# Patient Record
Sex: Female | Born: 1960 | Race: White | Hispanic: No | Marital: Married | Smoking: Never smoker
Health system: Southern US, Community
[De-identification: ages and names within clinical notes are randomized; demographics above are authoritative.]

## PROBLEM LIST (undated history)

## (undated) DIAGNOSIS — M5020 Other cervical disc displacement, unspecified cervical region: Secondary | ICD-10-CM

## (undated) DIAGNOSIS — E781 Pure hyperglyceridemia: Secondary | ICD-10-CM

## (undated) DIAGNOSIS — N816 Rectocele: Secondary | ICD-10-CM

## (undated) DIAGNOSIS — E7800 Pure hypercholesterolemia, unspecified: Secondary | ICD-10-CM

## (undated) DIAGNOSIS — IMO0002 Reserved for concepts with insufficient information to code with codable children: Secondary | ICD-10-CM

## (undated) DIAGNOSIS — N393 Stress incontinence (female) (male): Secondary | ICD-10-CM

## (undated) DIAGNOSIS — M5030 Other cervical disc degeneration, unspecified cervical region: Secondary | ICD-10-CM

## (undated) DIAGNOSIS — J302 Other seasonal allergic rhinitis: Secondary | ICD-10-CM

## (undated) DIAGNOSIS — Z803 Family history of malignant neoplasm of breast: Secondary | ICD-10-CM

## (undated) DIAGNOSIS — J45909 Unspecified asthma, uncomplicated: Secondary | ICD-10-CM

## (undated) DIAGNOSIS — Z8041 Family history of malignant neoplasm of ovary: Secondary | ICD-10-CM

## (undated) DIAGNOSIS — N952 Postmenopausal atrophic vaginitis: Secondary | ICD-10-CM

## (undated) DIAGNOSIS — D649 Anemia, unspecified: Secondary | ICD-10-CM

## (undated) DIAGNOSIS — N809 Endometriosis, unspecified: Secondary | ICD-10-CM

## (undated) DIAGNOSIS — R112 Nausea with vomiting, unspecified: Secondary | ICD-10-CM

## (undated) DIAGNOSIS — R638 Other symptoms and signs concerning food and fluid intake: Secondary | ICD-10-CM

## (undated) DIAGNOSIS — G43909 Migraine, unspecified, not intractable, without status migrainosus: Secondary | ICD-10-CM

## (undated) DIAGNOSIS — N951 Menopausal and female climacteric states: Secondary | ICD-10-CM

## (undated) DIAGNOSIS — N6019 Diffuse cystic mastopathy of unspecified breast: Secondary | ICD-10-CM

## (undated) DIAGNOSIS — Z8262 Family history of osteoporosis: Secondary | ICD-10-CM

## (undated) DIAGNOSIS — E538 Deficiency of other specified B group vitamins: Secondary | ICD-10-CM

## (undated) DIAGNOSIS — R0789 Other chest pain: Secondary | ICD-10-CM

## (undated) DIAGNOSIS — Z9889 Other specified postprocedural states: Secondary | ICD-10-CM

## (undated) HISTORY — PX: VAGINAL HYSTERECTOMY: SUR661

## (undated) HISTORY — DX: Pure hyperglyceridemia: E78.1

## (undated) HISTORY — DX: Other cervical disc degeneration, unspecified cervical region: M50.30

## (undated) HISTORY — DX: Family history of malignant neoplasm of breast: Z80.3

## (undated) HISTORY — DX: Other chest pain: R07.89

## (undated) HISTORY — PX: APPENDECTOMY: SHX54

## (undated) HISTORY — DX: Menopausal and female climacteric states: N95.1

## (undated) HISTORY — DX: Pure hypercholesterolemia, unspecified: E78.00

## (undated) HISTORY — PX: OOPHORECTOMY: SHX86

## (undated) HISTORY — DX: Reserved for concepts with insufficient information to code with codable children: IMO0002

## (undated) HISTORY — DX: Family history of osteoporosis: Z82.62

## (undated) HISTORY — DX: Postmenopausal atrophic vaginitis: N95.2

## (undated) HISTORY — DX: Other seasonal allergic rhinitis: J30.2

## (undated) HISTORY — DX: Family history of malignant neoplasm of ovary: Z80.41

## (undated) HISTORY — DX: Endometriosis, unspecified: N80.9

## (undated) HISTORY — DX: Rectocele: N81.6

## (undated) HISTORY — DX: Stress incontinence (female) (male): N39.3

## (undated) HISTORY — DX: Diffuse cystic mastopathy of unspecified breast: N60.19

## (undated) HISTORY — DX: Other cervical disc displacement, unspecified cervical region: M50.20

## (undated) HISTORY — PX: LAPAROSCOPY: SHX197

## (undated) HISTORY — DX: Deficiency of other specified B group vitamins: E53.8

## (undated) HISTORY — DX: Unspecified asthma, uncomplicated: J45.909

## (undated) HISTORY — DX: Migraine, unspecified, not intractable, without status migrainosus: G43.909

## (undated) HISTORY — DX: Other symptoms and signs concerning food and fluid intake: R63.8

---

## 1997-12-05 ENCOUNTER — Other Ambulatory Visit: Admission: RE | Admit: 1997-12-05 | Discharge: 1997-12-05 | Payer: Self-pay | Admitting: *Deleted

## 1999-04-11 ENCOUNTER — Other Ambulatory Visit: Admission: RE | Admit: 1999-04-11 | Discharge: 1999-04-11 | Payer: Self-pay | Admitting: *Deleted

## 1999-04-12 ENCOUNTER — Encounter: Payer: Self-pay | Admitting: *Deleted

## 1999-04-12 ENCOUNTER — Encounter: Admission: RE | Admit: 1999-04-12 | Discharge: 1999-04-12 | Payer: Self-pay | Admitting: *Deleted

## 2000-10-28 ENCOUNTER — Other Ambulatory Visit: Admission: RE | Admit: 2000-10-28 | Discharge: 2000-10-28 | Payer: Self-pay | Admitting: *Deleted

## 2001-01-07 ENCOUNTER — Encounter: Admission: RE | Admit: 2001-01-07 | Discharge: 2001-01-07 | Payer: Self-pay | Admitting: *Deleted

## 2001-01-07 ENCOUNTER — Encounter: Payer: Self-pay | Admitting: *Deleted

## 2002-01-18 ENCOUNTER — Encounter: Admission: RE | Admit: 2002-01-18 | Discharge: 2002-01-18 | Payer: Self-pay | Admitting: *Deleted

## 2002-01-18 ENCOUNTER — Encounter: Payer: Self-pay | Admitting: *Deleted

## 2002-01-20 ENCOUNTER — Other Ambulatory Visit: Admission: RE | Admit: 2002-01-20 | Discharge: 2002-01-20 | Payer: Self-pay | Admitting: *Deleted

## 2004-11-01 ENCOUNTER — Ambulatory Visit: Payer: Self-pay

## 2006-11-12 ENCOUNTER — Ambulatory Visit: Payer: Self-pay | Admitting: Obstetrics and Gynecology

## 2007-02-26 HISTORY — PX: BILATERAL SALPINGOOPHORECTOMY: SHX1223

## 2007-10-21 ENCOUNTER — Ambulatory Visit: Payer: Self-pay | Admitting: Specialist

## 2007-10-21 ENCOUNTER — Other Ambulatory Visit: Payer: Self-pay

## 2007-10-28 ENCOUNTER — Ambulatory Visit: Payer: Self-pay | Admitting: Specialist

## 2007-11-19 ENCOUNTER — Ambulatory Visit: Payer: Self-pay

## 2008-02-04 ENCOUNTER — Ambulatory Visit: Payer: Self-pay | Admitting: Obstetrics and Gynecology

## 2008-02-08 ENCOUNTER — Ambulatory Visit: Payer: Self-pay | Admitting: Obstetrics and Gynecology

## 2008-12-29 ENCOUNTER — Ambulatory Visit: Payer: Self-pay

## 2011-12-19 ENCOUNTER — Ambulatory Visit: Payer: Self-pay | Admitting: Specialist

## 2012-06-09 ENCOUNTER — Other Ambulatory Visit: Payer: Self-pay | Admitting: Neurology

## 2012-06-09 LAB — COMPREHENSIVE METABOLIC PANEL
Albumin: 4.1 g/dL (ref 3.4–5.0)
Alkaline Phosphatase: 69 U/L (ref 50–136)
Anion Gap: 5 — ABNORMAL LOW (ref 7–16)
BUN: 23 mg/dL — ABNORMAL HIGH (ref 7–18)
Bilirubin,Total: 0.3 mg/dL (ref 0.2–1.0)
Calcium, Total: 8.8 mg/dL (ref 8.5–10.1)
Chloride: 105 mmol/L (ref 98–107)
Co2: 28 mmol/L (ref 21–32)
Creatinine: 1.02 mg/dL (ref 0.60–1.30)
EGFR (African American): >60
EGFR (Non-African Amer.): >60
Glucose: 106 mg/dL — ABNORMAL HIGH (ref 65–99)
Osmolality: 280 (ref 275–301)
Potassium: 3.8 mmol/L (ref 3.5–5.1)
SGOT(AST): 21 U/L (ref 15–37)
SGPT (ALT): 31 U/L (ref 12–78)
Sodium: 138 mmol/L (ref 136–145)
Total Protein: 7.7 g/dL (ref 6.4–8.2)

## 2012-06-09 LAB — CBC WITH DIFFERENTIAL/PLATELET
Basophil #: 0 10*3/uL (ref 0.0–0.1)
Basophil %: 0.8 %
Eosinophil #: 0.1 10*3/uL (ref 0.0–0.7)
Eosinophil %: 1.2 %
HCT: 38.8 % (ref 35.0–47.0)
HGB: 13.4 g/dL (ref 12.0–16.0)
Lymphocyte #: 2 10*3/uL (ref 1.0–3.6)
Lymphocyte %: 32.5 %
MCH: 29.5 pg (ref 26.0–34.0)
MCHC: 34.5 g/dL (ref 32.0–36.0)
MCV: 85 fL (ref 80–100)
Monocyte #: 0.3 x10 3/mm (ref 0.2–0.9)
Monocyte %: 5.4 %
Neutrophil #: 3.7 10*3/uL (ref 1.4–6.5)
Neutrophil %: 60.1 %
Platelet: 234 10*3/uL (ref 150–440)
RBC: 4.55 10*6/uL (ref 3.80–5.20)
RDW: 14.3 % (ref 11.5–14.5)
WBC: 6.1 10*3/uL (ref 3.6–11.0)

## 2012-06-09 LAB — TSH: Thyroid Stimulating Horm: 1.96 u[IU]/mL

## 2012-06-09 LAB — HEMOGLOBIN A1C: Hemoglobin A1C: 5.9 %

## 2012-06-09 LAB — SEDIMENTATION RATE: Erythrocyte Sed Rate: 18 mm/h

## 2012-06-23 ENCOUNTER — Ambulatory Visit: Payer: Self-pay | Admitting: Neurology

## 2013-06-24 LAB — HM PAP SMEAR: HM PAP: NEGATIVE

## 2013-10-13 ENCOUNTER — Ambulatory Visit: Payer: Self-pay | Admitting: Obstetrics and Gynecology

## 2013-10-14 LAB — HM MAMMOGRAPHY

## 2013-10-18 ENCOUNTER — Ambulatory Visit: Payer: Self-pay | Admitting: Gastroenterology

## 2013-10-18 LAB — HM COLONOSCOPY: HM COLON: NORMAL

## 2014-06-28 ENCOUNTER — Other Ambulatory Visit: Payer: Self-pay | Admitting: Obstetrics and Gynecology

## 2014-06-28 DIAGNOSIS — Z780 Asymptomatic menopausal state: Secondary | ICD-10-CM

## 2014-06-28 DIAGNOSIS — Z8262 Family history of osteoporosis: Secondary | ICD-10-CM

## 2014-06-28 DIAGNOSIS — Z01419 Encounter for gynecological examination (general) (routine) without abnormal findings: Secondary | ICD-10-CM

## 2014-07-05 ENCOUNTER — Ambulatory Visit: Payer: Self-pay

## 2014-07-12 ENCOUNTER — Ambulatory Visit: Payer: Self-pay

## 2014-07-20 ENCOUNTER — Ambulatory Visit
Admission: RE | Admit: 2014-07-20 | Discharge: 2014-07-20 | Disposition: A | Discharge status: Home / Self Care | Payer: 59 | Source: Ambulatory Visit | Attending: Obstetrics and Gynecology | Admitting: Obstetrics and Gynecology

## 2014-07-20 DIAGNOSIS — Z780 Asymptomatic menopausal state: Secondary | ICD-10-CM | POA: Diagnosis present

## 2014-07-20 DIAGNOSIS — Z13820 Encounter for screening for osteoporosis: Secondary | ICD-10-CM | POA: Diagnosis not present

## 2014-07-20 DIAGNOSIS — Z8262 Family history of osteoporosis: Secondary | ICD-10-CM

## 2014-07-22 LAB — HM DEXA SCAN

## 2014-10-17 ENCOUNTER — Other Ambulatory Visit: Payer: Self-pay | Admitting: Obstetrics and Gynecology

## 2014-10-17 ENCOUNTER — Ambulatory Visit
Admission: RE | Admit: 2014-10-17 | Discharge: 2014-10-17 | Disposition: A | Discharge status: Home / Self Care | Payer: Commercial Managed Care - PPO | Source: Ambulatory Visit | Attending: Obstetrics and Gynecology | Admitting: Obstetrics and Gynecology

## 2014-10-17 DIAGNOSIS — Z1231 Encounter for screening mammogram for malignant neoplasm of breast: Secondary | ICD-10-CM | POA: Insufficient documentation

## 2014-10-17 DIAGNOSIS — Z01419 Encounter for gynecological examination (general) (routine) without abnormal findings: Secondary | ICD-10-CM | POA: Diagnosis present

## 2015-07-04 ENCOUNTER — Encounter: Payer: Self-pay | Admitting: Obstetrics and Gynecology

## 2015-07-04 ENCOUNTER — Ambulatory Visit (INDEPENDENT_AMBULATORY_CARE_PROVIDER_SITE_OTHER): Payer: Commercial Managed Care - PPO | Admitting: Obstetrics and Gynecology

## 2015-07-04 VITALS — BP 120/81 | HR 75 | Ht 66.0 in | Wt 195.1 lb

## 2015-07-04 DIAGNOSIS — Z90710 Acquired absence of both cervix and uterus: Secondary | ICD-10-CM | POA: Diagnosis not present

## 2015-07-04 DIAGNOSIS — J302 Other seasonal allergic rhinitis: Secondary | ICD-10-CM

## 2015-07-04 DIAGNOSIS — N8110 Cystocele, unspecified: Secondary | ICD-10-CM | POA: Diagnosis not present

## 2015-07-04 DIAGNOSIS — R638 Other symptoms and signs concerning food and fluid intake: Secondary | ICD-10-CM | POA: Diagnosis not present

## 2015-07-04 DIAGNOSIS — Z90722 Acquired absence of ovaries, bilateral: Secondary | ICD-10-CM

## 2015-07-04 DIAGNOSIS — Z803 Family history of malignant neoplasm of breast: Secondary | ICD-10-CM | POA: Diagnosis not present

## 2015-07-04 DIAGNOSIS — Z1239 Encounter for other screening for malignant neoplasm of breast: Secondary | ICD-10-CM | POA: Diagnosis not present

## 2015-07-04 DIAGNOSIS — N816 Rectocele: Secondary | ICD-10-CM

## 2015-07-04 DIAGNOSIS — IMO0002 Reserved for concepts with insufficient information to code with codable children: Secondary | ICD-10-CM | POA: Insufficient documentation

## 2015-07-04 DIAGNOSIS — Z8041 Family history of malignant neoplasm of ovary: Secondary | ICD-10-CM

## 2015-07-04 DIAGNOSIS — Z01419 Encounter for gynecological examination (general) (routine) without abnormal findings: Secondary | ICD-10-CM

## 2015-07-04 DIAGNOSIS — N809 Endometriosis, unspecified: Secondary | ICD-10-CM

## 2015-07-04 DIAGNOSIS — Z0000 Encounter for general adult medical examination without abnormal findings: Secondary | ICD-10-CM | POA: Diagnosis not present

## 2015-07-04 NOTE — Progress Notes (Signed)
GYN ANNUAL PREVENTATIVE CARE ENCOUNTER NOTE  Subjective:       Sharon Lewis is a 55 y.o. [id number] female here for a routine annual gynecologic exam.   History of breast cancer and ovarian cancer in sister. Patient is status post TVH and laparoscopic BSO. Screening colonoscopy last year was normal. Bowel bladder function are normal. Patient is not experiencing any vasomotor symptoms. She denies significant urinary incontinence..     Gynecologic History No LMP recorded. Patient is postmenopausal. Contraception: status post hysterectomy Last Pap: 06/24/2013 negative/negative Last mammogram: 10/17/2014 BI-RADS 1  Obstetric History OB History  Gravida Para Term Preterm AB SAB TAB Ectopic Multiple Living  2 2 2       2     # Outcome Date GA Lbr Len/2nd Weight Sex Delivery Anes PTL Lv  2 Term      Vag-Spont   Y  1 Term      Vag-Spont   Y      Past Medical History  Diagnosis Date  . Hypertriglyceridemia   . Migraines   . Seasonal allergies   . Atypical chest pain   . Asthma   . FH: osteoporosis   . Rectocele   . Cystocele   . Menopausal state   . Increased BMI   . Vaginal atrophy   . Fibrocystic breast   . Endometriosis   . Stress incontinence   . FH: breast cancer   . FH: ovarian cancer     Past Surgical History  Procedure Laterality Date  . Bilateral salpingoophorectomy  2009  . Vaginal hysterectomy    . Appendectomy    . Laparoscopy      No current outpatient prescriptions on file prior to visit.   No current facility-administered medications on file prior to visit.    Allergies  Allergen Reactions  . Levaquin [Levofloxacin In D5w]     Social History   Social History  . Marital Status: Married    Spouse Name: N/A  . Number of Children: N/A  . Years of Education: N/A   Occupational History  . Not on file.   Social History Main Topics  . Smoking status: Never Smoker   . Smokeless tobacco: Not on file  . Alcohol Use: Yes     Comment: occas  .  Drug Use: No  . Sexual Activity: Yes    Birth Control/ Protection: Surgical   Other Topics Concern  . Not on file   Social History Narrative    Family History  Problem Relation Age of Onset  . Breast cancer Sister [id number]  . Ovarian cancer Sister   . Diabetes Sister   . Breast cancer Maternal Aunt 70    68  . Breast cancer Cousin   . Osteoporosis Mother   . Diabetes Father   . Heart disease Father   . Colon cancer Paternal Uncle     The following portions of the patient's history were reviewed and updated as appropriate: allergies, current medications, past family history, past medical history, past social history, past surgical history and problem list.  Review of Systems Review of Systems  Constitutional: Negative.   HENT: Negative.   Respiratory: Negative.   Genitourinary: Negative.   Musculoskeletal: Negative.   Skin: Negative.   Neurological: Negative.   Psychiatric/Behavioral: Negative.      Objective:   BP 120/81 mmHg  Pulse 75  Ht 5\' 6"  (1.676 m)  Wt 195 lb 1 oz (88.48 kg)  BMI 31.50 kg/m2 Physical Exam  Constitutional: She is oriented to person, place, and time. She appears well-developed and well-nourished.  HENT:  Head: Normocephalic and atraumatic.  Eyes: Conjunctivae and EOM are normal. No scleral icterus.  Neck: Normal range of motion. Neck supple. Thyromegaly present.  Cardiovascular: Normal rate and regular rhythm.   No murmur heard. Pulmonary/Chest: Effort normal and breath sounds normal.  Abdominal: Soft. Bowel sounds are normal. There is no tenderness.  Genitourinary:  Normal BUS normal Vagina-first-degree cystocele; small rectocele; good apical support; mild decreased estrogen effect Cervix-surgically absent Uterus-surgically absent  adnexa-nonpalpable nontender Rectovaginal-external hemorrhoids, nonthrombosed; normal sphincter tone; no rectal masses  Musculoskeletal: She exhibits no edema.  Lymphadenopathy:    She has no cervical  adenopathy.  Neurological: She is alert and oriented to person, place, and time.  Skin: Skin is warm and dry.  Psychiatric: Her behavior is normal.    Assessment:   Annual gynecologic examination 55 y.o. Contraception: status post hysterectomy Normal BMI, Overweight and Obesity 2 There are no active problems to display for this patient.  Family history ovarian cancer and breast cancer  Plan:  Pap: Not needed and Not done Mammogram: Ordered Labs: per primary care Routine preventative health maintenance measures emphasized: Diet/Weight control, Tobacco Cessation and Alcohol/Drug use Resume vitamin D supplementation Return to Clinic - 1 Year   Herold Harms, MD   Note: This dictation was prepared with Dragon dictation along with smaller phrase technology. Any transcriptional errors that result from this process are unintentional.

## 2015-07-04 NOTE — Patient Instructions (Signed)
1. No Pap smear. 2. Mammogram ordered 3. No stool guaiac cards given because of history of colonoscopy 4. Restart calcium with vitamin D supplementation 1200 mg daily 5. Continue with healthy eating, exercise, and weight loss 6. Return in 1 year

## 2015-11-27 ENCOUNTER — Other Ambulatory Visit: Payer: Self-pay | Admitting: Obstetrics and Gynecology

## 2015-11-27 ENCOUNTER — Ambulatory Visit
Admission: RE | Admit: 2015-11-27 | Discharge: 2015-11-27 | Disposition: A | Discharge status: Home / Self Care | Payer: Commercial Managed Care - PPO | Source: Ambulatory Visit | Attending: Obstetrics and Gynecology | Admitting: Obstetrics and Gynecology

## 2015-11-27 DIAGNOSIS — Z1231 Encounter for screening mammogram for malignant neoplasm of breast: Secondary | ICD-10-CM | POA: Insufficient documentation

## 2015-11-27 DIAGNOSIS — Z1239 Encounter for other screening for malignant neoplasm of breast: Secondary | ICD-10-CM

## 2016-04-24 ENCOUNTER — Emergency Department
Admission: EM | Admit: 2016-04-24 | Discharge: 2016-04-24 | Disposition: A | Discharge status: Home / Self Care | Payer: Worker's Compensation | Attending: Student in an Organized Health Care Education/Training Program | Admitting: Student in an Organized Health Care Education/Training Program

## 2016-04-24 ENCOUNTER — Encounter: Payer: Self-pay | Admitting: *Deleted

## 2016-04-24 DIAGNOSIS — Z7721 Contact with and (suspected) exposure to potentially hazardous body fluids: Secondary | ICD-10-CM | POA: Diagnosis not present

## 2016-04-24 DIAGNOSIS — Z578 Occupational exposure to other risk factors: Secondary | ICD-10-CM

## 2016-04-24 DIAGNOSIS — J45909 Unspecified asthma, uncomplicated: Secondary | ICD-10-CM | POA: Diagnosis not present

## 2016-04-24 LAB — COMPREHENSIVE METABOLIC PANEL
ALBUMIN: 4.4 g/dL (ref 3.5–5.0)
ALT: 24 U/L (ref 14–54)
ANION GAP: 8 (ref 5–15)
AST: 22 U/L (ref 15–41)
Alkaline Phosphatase: 60 U/L (ref 38–126)
BILIRUBIN TOTAL: 0.7 mg/dL (ref 0.3–1.2)
BUN: 17 mg/dL (ref 6–20)
CO2: 27 mmol/L (ref 22–32)
Calcium: 9.1 mg/dL (ref 8.9–10.3)
Chloride: 104 mmol/L (ref 101–111)
Creatinine, Ser: 1 mg/dL (ref 0.44–1.00)
GFR calc Af Amer: >60 mL/min (ref >60)
Glucose, Bld: 102 mg/dL — ABNORMAL HIGH (ref 65–99)
Potassium: 4 mmol/L (ref 3.5–5.1)
Sodium: 139 mmol/L (ref 135–145)
TOTAL PROTEIN: 7.7 g/dL (ref 6.5–8.1)

## 2016-04-24 LAB — CBC
HEMATOCRIT: 38.5 % (ref 35.0–47.0)
Hemoglobin: 13.4 g/dL (ref 12.0–16.0)
MCH: 29.4 pg (ref 26.0–34.0)
MCHC: 34.8 g/dL (ref 32.0–36.0)
MCV: 84.6 fL (ref 80.0–100.0)
Platelets: 257 10*3/uL (ref 150–440)
RBC: 4.55 MIL/uL (ref 3.80–5.20)
RDW: 14.9 % — AB (ref 11.5–14.5)
WBC: 7.7 10*3/uL (ref 3.6–11.0)

## 2016-04-24 MED ORDER — ONDANSETRON HCL 4 MG PO TABS: 4.0000 mg | ORAL_TABLET | Freq: Every day | ORAL | 0 refills | Status: DC | PRN

## 2016-04-24 MED ORDER — LAMIVUDINE-ZIDOVUDINE 150-300 MG PO TABS: 1.0000 | ORAL_TABLET | Freq: Two times a day (BID) | ORAL | 0 refills | Status: AC

## 2016-04-24 NOTE — Discharge Instructions (Signed)
Please follow-up with occupational medicine in the morning. Even provide medication prescriptions for post exposure prophylaxis. Should any of her test results come back positive you will be contacted by there are facility or your HR department. Return to the ER should she have any worsening symptoms, concerns or questions.

## 2016-04-24 NOTE — ED Notes (Signed)
Pt unable to urinate for UDS at this moment. Pt aware that a UDS is needed for workers comp.

## 2016-04-24 NOTE — ED Triage Notes (Signed)
Pt has a fingerstick to right middle finger.  Pt was doing a TB skin clinic today and got stuck while putting needle in sharps container.  States WC

## 2016-04-24 NOTE — ED Provider Notes (Signed)
[facility] Provider Note    None    (approximate)  I have reviewed the triage vital signs and the nursing notes.   HISTORY  Chief Complaint Body Fluid Exposure    HPI Sharon Lewis is a 56 y.o. 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 She actually stuck her finger left middle digit while trying to place one of the TB syringes and the sharps container. There was no visible blood on the needleand no blood on the patient receiving a TB skin test. Patient had small amount of blood on her finger tip consistent with a glucose check. She did expel as much blood as possible and extensively washed her hands. States that the person that was exposed the needle is well-known to her and does not appear to be a high risk patient for HIV that she does not know her status and the patient has not been able to come to the facility for evaluation yet.   Past Medical History:  Diagnosis Date  . Asthma   . Atypical chest pain   . Cystocele   . Endometriosis   . FH: breast cancer   . FH: osteoporosis   . FH: ovarian cancer   . Fibrocystic breast   . Hypertriglyceridemia   . Increased BMI   . Menopausal state   . Migraines   . Rectocele   . Seasonal allergies   . Stress incontinence   . Vaginal atrophy    Family History  Problem Relation Age of Onset  . Breast cancer Sister 43  . Ovarian cancer Sister   . Diabetes Sister   . Breast cancer Maternal Aunt 70    68  . Breast cancer Cousin   . Osteoporosis Mother   . Diabetes Father   . Heart disease Father   . Colon cancer Paternal Uncle    Past Surgical History:  Procedure Laterality Date  . APPENDECTOMY    . BILATERAL SALPINGOOPHORECTOMY  2009  . LAPAROSCOPY    . VAGINAL HYSTERECTOMY     Patient Active Problem List   Diagnosis Date Noted  . Endometriosis 07/04/2015  . Status post vaginal hysterectomy  07/04/2015  . S/P BSO (status post bilateral salpingo-oophorectomy) 07/04/2015  . Family history of breast cancer in sister 07/04/2015  . Family history of ovarian cancer 07/04/2015  . Seasonal allergies 07/04/2015  . Increased BMI 07/04/2015  . Cystocele 07/04/2015  . Rectocele 07/04/2015      Prior to Admission medications   Medication Sig Start Date End Date Taking? Authorizing Provider  Multiple Vitamins-Minerals (MULTIVITAMIN & MINERAL PO) Take by mouth.    Historical Provider, MD    Allergies Levaquin [levofloxacin in d5w]    Social History Social History  Substance Use Topics  . Smoking status: Never Smoker  . Smokeless tobacco: Never Used  . Alcohol use No     Comment: occas    Review of Systems Patient denies headaches, rhinorrhea, blurry vision, numbness, shortness of breath, chest pain, edema, cough, abdominal pain, nausea, vomiting, diarrhea, dysuria, fevers, rashes or hallucinations unless otherwise stated above in HPI. ____________________________________________   PHYSICAL EXAM:  VITAL SIGNS: Vitals:   04/24/16 1921  BP: (!) 146/84  Pulse: 71  Resp: 20  Temp: 98.1 F (36.7 C)    Constitutional: Alert and oriented. Well appearing and in no acute distress. Eyes: Conjunctivae are normal. PERRL. EOMI. Head: Atraumatic. Nose:  No congestion/rhinnorhea. Mouth/Throat: Mucous membranes are moist.  Oropharynx non-erythematous. Cardiovascular: Normal rate, regular rhythm. Grossly normal heart sounds.  Good peripheral circulation. Respiratory: Normal respiratory effort.  No retractions. Lungs CTAB. Gastrointestinal: No distention.  Musculoskeletal: No lower extremity tenderness nor edema.  No joint effusions. Neurologic:  Normal speech and language. No gross focal neurologic deficits are appreciated. No gait instability. Skin:  Skin is warm, dry and intact. No rash noted.  Small needle injection site on left third  digit ____________________________________________   LABS (all labs ordered are listed, but only abnormal results are displayed)  No results found for this or any previous visit (from the past 24 hour(s)). ____________________________________________  EKG____________________________________________  RADIOLOGY   ____________________________________________   PROCEDURES  Procedure(s) performed:  Procedures    Critical Care performed: no ____________________________________________   INITIAL IMPRESSION / ASSESSMENT AND PLAN / ED COURSE  Pertinent labs & imaging results that were available during my care of the patient were reviewed by me and considered in my medical decision making (see chart for details).  DDX: body fluid exposure  Sharon Lewis is a 56 y.o. who presents to the ED with what appears to be a low risk body fluid exposure today. It was a small gauge needle without any evidence of visible blood. A high-risk patient as far as Therapist, artMiss Lewis describes. We did discuss the likelihood and percentages of seroconversion with body fluid exposure even in the highest risk scenario this. As he do not currently know the status of the patient, Sharon Lewis has requested a prescription for postexposure prophylaxis but does not plan to take it until having more discussion with her family and occupational medicine in the morning. Patient is otherwise healthy. I do think this is a reasonable option and do feel this is very low risk exposure.  Have discussed with the patient and available family all diagnostics and treatments performed thus far and all questions were answered to the best of my ability. The patient demonstrates understanding and agreement with plan.       ____________________________________________   FINAL CLINICAL IMPRESSION(S) / ED DIAGNOSES  Final diagnoses:  Employee exposure to blood      NEW MEDICATIONS STARTED DURING THIS VISIT:  New Prescriptions   No  medications on file     Note:  This document was prepared using Dragon voice recognition software and may include unintentional dictation errors.    Sharon EddyPatrick Opie Fanton, MD 04/24/16 2044

## 2016-04-24 NOTE — ED Notes (Signed)
Blood was drawn per [employer] post exposure requirements with paperwork and blood work filled out and labeld appropriately. Blood walked to lab by RN and given to lab personal. Workers comp urine was also obtained. Paperwork filled out and photo ID used. Chain of custody was not broken. Urine collected by this RN and this RN walked urine to lab and gave urine to [facility] lab personal. Urine sealed in pts presence.

## 2016-05-03 ENCOUNTER — Other Ambulatory Visit: Payer: Self-pay | Admitting: Internal Medicine

## 2016-05-03 DIAGNOSIS — M5090 Cervical disc disorder, unspecified, unspecified cervical region: Secondary | ICD-10-CM

## 2016-05-17 ENCOUNTER — Ambulatory Visit: Admission: RE | Admit: 2016-05-17 | Payer: Commercial Managed Care - PPO | Source: Ambulatory Visit

## 2016-05-31 ENCOUNTER — Ambulatory Visit
Admission: RE | Admit: 2016-05-31 | Discharge: 2016-05-31 | Disposition: A | Discharge status: Home / Self Care | Payer: Commercial Managed Care - PPO | Source: Ambulatory Visit | Attending: Internal Medicine | Admitting: Internal Medicine

## 2016-05-31 DIAGNOSIS — M47892 Other spondylosis, cervical region: Secondary | ICD-10-CM | POA: Diagnosis not present

## 2016-05-31 DIAGNOSIS — M2578 Osteophyte, vertebrae: Secondary | ICD-10-CM | POA: Diagnosis not present

## 2016-05-31 DIAGNOSIS — M4802 Spinal stenosis, cervical region: Secondary | ICD-10-CM | POA: Diagnosis not present

## 2016-05-31 DIAGNOSIS — M5090 Cervical disc disorder, unspecified, unspecified cervical region: Secondary | ICD-10-CM

## 2016-05-31 DIAGNOSIS — M50222 Other cervical disc displacement at C5-C6 level: Secondary | ICD-10-CM | POA: Insufficient documentation

## 2016-07-09 ENCOUNTER — Encounter: Payer: 59 | Admitting: Obstetrics and Gynecology

## 2016-07-11 ENCOUNTER — Encounter: Payer: Commercial Managed Care - PPO | Admitting: Obstetrics and Gynecology

## 2016-07-11 ENCOUNTER — Encounter: Payer: Self-pay | Admitting: Obstetrics and Gynecology

## 2016-07-11 ENCOUNTER — Ambulatory Visit (INDEPENDENT_AMBULATORY_CARE_PROVIDER_SITE_OTHER): Payer: Commercial Managed Care - PPO | Admitting: Obstetrics and Gynecology

## 2016-07-11 VITALS — BP 153/76 | HR 82 | Ht 66.0 in | Wt 197.0 lb

## 2016-07-11 DIAGNOSIS — Z8041 Family history of malignant neoplasm of ovary: Secondary | ICD-10-CM | POA: Diagnosis not present

## 2016-07-11 DIAGNOSIS — Z01419 Encounter for gynecological examination (general) (routine) without abnormal findings: Secondary | ICD-10-CM

## 2016-07-11 DIAGNOSIS — N809 Endometriosis, unspecified: Secondary | ICD-10-CM

## 2016-07-11 DIAGNOSIS — Z1239 Encounter for other screening for malignant neoplasm of breast: Secondary | ICD-10-CM

## 2016-07-11 DIAGNOSIS — Z1231 Encounter for screening mammogram for malignant neoplasm of breast: Secondary | ICD-10-CM | POA: Diagnosis not present

## 2016-07-11 DIAGNOSIS — Z803 Family history of malignant neoplasm of breast: Secondary | ICD-10-CM | POA: Diagnosis not present

## 2016-07-11 DIAGNOSIS — Z90710 Acquired absence of both cervix and uterus: Secondary | ICD-10-CM

## 2016-07-11 DIAGNOSIS — Z1211 Encounter for screening for malignant neoplasm of colon: Secondary | ICD-10-CM

## 2016-07-11 DIAGNOSIS — Z90722 Acquired absence of ovaries, bilateral: Secondary | ICD-10-CM

## 2016-07-11 DIAGNOSIS — R638 Other symptoms and signs concerning food and fluid intake: Secondary | ICD-10-CM

## 2016-07-11 NOTE — Progress Notes (Signed)
GYN ANNUAL PREVENTATIVE CARE ENCOUNTER NOTE  Subjective:       Sharon Lewis is a 56 y.o. 132P2002 female here for a routine annual gynecologic exam.   Complaints: None  History of breast cancer and ovarian cancer in sister. Patient is status post TVH and laparoscopic BSO.  Bowel and bladder function are normal. Major interval health issues include diagnosis of vitamin B12 deficiency which is being corrected at this time. 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 Sharon Lewis has not experienced any significant hot flashes since her laparoscopic BSO. She is not complaining of any significant vaginal dryness.  Gynecologic History No LMP recorded. Patient has had a TVH; status post laparoscopic BSO. Contraception: status post hysterectomy Last Pap: 06/24/2013 negative/negative Last mammogram: 11/2015  BI-RADS 1  Obstetric History OB History  Gravida Para Term Preterm AB Living  2 2 2     2   SAB TAB Ectopic Multiple Live Births          2    # Outcome Date GA Lbr Len/2nd Weight Sex Delivery Anes PTL Lv  2 Term      Vag-Spont   LIV  1 Term      Vag-Spont   LIV      Past Medical History:  Diagnosis Date  . Asthma   . Atypical chest pain   . Cystocele   . Endometriosis   . FH: breast cancer   . FH: osteoporosis   . FH: ovarian cancer   . Fibrocystic breast   . Hypertriglyceridemia   . Increased BMI   . Menopausal state   . Migraines   . Rectocele   . Seasonal allergies   . Stress incontinence   . Vaginal atrophy     Past Surgical History:  Procedure Laterality Date  . APPENDECTOMY    . BILATERAL SALPINGOOPHORECTOMY  2009  . LAPAROSCOPY    . VAGINAL HYSTERECTOMY      Current Outpatient Prescriptions on File Prior to Visit  Medication Sig Dispense Refill  . Multiple Vitamins-Minerals (MULTIVITAMIN & MINERAL PO) Take by mouth.    . ondansetron (ZOFRAN) 4 MG tablet Take 1 tablet (4 mg  total) by mouth daily as needed for nausea or vomiting. 14 tablet 0  . ondansetron (ZOFRAN) 4 MG tablet Take 1 tablet (4 mg total) by mouth daily as needed for nausea or vomiting. 14 tablet 0   No current facility-administered medications on file prior to visit.     Allergies  Allergen Reactions  . Levaquin [Levofloxacin In D5w]     Social History   Social History  . Marital status: Married    Spouse name: N/A  . Number of children: N/A  . Years of education: N/A   Occupational History  . Not on file.   Social History Main Topics  . Smoking status: Never Smoker  . Smokeless tobacco: Never Used  . Alcohol use No     Comment: occas  . Drug use: No  . Sexual activity: Yes    Birth control/ protection: Surgical   Other Topics Concern  . Not on file   Social History Narrative  . No narrative on file    Family History  Problem Relation Age of Onset  . Breast cancer Sister [id number]  . Ovarian cancer Sister   . Diabetes Sister   . Breast cancer Maternal Aunt 5570  68  . Breast cancer Cousin   . Osteoporosis Mother   . Diabetes Father   . Heart disease Father   . Colon cancer Paternal Uncle     The following portions of the patient's history were reviewed and updated as appropriate: allergies, current medications, past family history, past medical history, past social history, past surgical history and problem list.  Review of Systems Review of Systems  Constitutional: Negative.        No vasomotor symptoms  HENT: Negative.   Respiratory: Negative.   Cardiovascular: Negative.   Gastrointestinal: Negative.   Genitourinary: Negative.        No incontinence  Musculoskeletal: Negative.   Skin: Negative.   Neurological: Negative.   Endo/Heme/Allergies: Negative.   Psychiatric/Behavioral: Negative.     BP (!) 153/76   Pulse 82   Ht 5\' 6"  (1.676 m)   Wt 197 lb (89.4 kg)   BMI 31.80 kg/m   Objective:    Physical Exam  Constitutional: She is oriented to  person, place, and time. She appears well-developed and well-nourished.  HENT:  Head: Normocephalic and atraumatic.  Eyes: Conjunctivae and EOM are normal. No scleral icterus.  Neck: Normal range of motion. Neck supple. No thyromegaly present.  Cardiovascular: Normal rate, regular rhythm and normal heart sounds.   No murmur heard. Pulmonary/Chest: Effort normal and breath sounds normal.  Abdominal: Soft. She exhibits no distension and no mass. There is no tenderness. No hernia.  Genitourinary:  Genitourinary Comments: External genitalia-Normal BUS normal Vagina-first-degree cystocele; small rectocele; good apical support; mild decreased estrogen effect Cervix-surgically absent Uterus-surgically absent  adnexa-nonpalpable nontender Rectovaginal-external hemorrhoids, nonthrombosed; normal sphincter tone; no rectal masses  Musculoskeletal: She exhibits no edema or tenderness.  Lymphadenopathy:    She has no cervical adenopathy.  Neurological: She is alert and oriented to person, place, and time.  Skin: Skin is warm and dry.  Psychiatric: Her behavior is normal.    Assessment:   Annual gynecologic examination 56 y.o. Contraception: status post hysterectomy [id number] Patient Active Problem List   Diagnosis Date Noted  . Endometriosis 07/04/2015  . Status post vaginal hysterectomy 07/04/2015  . S/P BSO (status post bilateral salpingo-oophorectomy) 07/04/2015  . Family history of breast cancer in sister 07/04/2015  . Family history of ovarian cancer 07/04/2015  . Seasonal allergies 07/04/2015  . Increased BMI 07/04/2015  . Cystocele 07/04/2015  . Rectocele 07/04/2015   Family history ovarian cancer and breast cancer  Patient has not had genetic testing; nor has her sister had genetic testing. There are numerous family members on the paternal side who have had breast and colon cancer. The patient does have a daughter. She is to contemplate genetic testing; information is  given.  Plan:  Pap: Not needed Mammogram: Ordered  Stool cards- ordered  Labs: per primary care Routine preventative health maintenance measures emphasized: Diet/Weight control, Tobacco Cessation and Alcohol/Drug use Resume vitamin D supplementation Discussed genetic testing for cancer syndromes, including breast and ovarian; literature is given to the patient Return to Clinic - 1 Year   Crystal [place], CMA  Herold Harms, MD   Note: This dictation was prepared with Dragon dictation along with smaller phrase technology. Any transcriptional errors that result from this process are unintentional.

## 2016-07-11 NOTE — Patient Instructions (Addendum)
1. No Pap smear is needed 2. Mammogram is ordered 3. Stool guaiac cards are given for colon cancer screening 4. Screening labs will be obtained through primary care 5. Continue with healthy eating and exercise with controlled weight loss 6. Continue with calcium and vitamin D supplementation daily 7. Return in 1 year for annual exam 8. Literature is given regarding genetic testing for cancer syndromes. Patient will contact us if she desires to have screening.   Health Maintenance for Postmenopausal Women Menopause is a normal process in which your reproductive ability comes to an end. This process happens gradually over a span of months to years, usually between the ages of 38 and 64. Menopause is complete when you have missed 12 consecutive menstrual periods. It is important to talk with your health care provider about some of the most common conditions that affect postmenopausal women, such as heart disease, cancer, and bone loss (osteoporosis). Adopting a healthy lifestyle and getting preventive care can help to promote your health and wellness. Those actions can also lower your chances of developing some of these common conditions. What should I know about menopause? During menopause, you may experience a number of symptoms, such as:  Moderate-to-severe hot flashes.  Night sweats.  Decrease in sex drive.  Mood swings.  Headaches.  Tiredness.  Irritability.  Memory problems.  Insomnia. Choosing to treat or not to treat menopausal changes is an individual decision that you make with your health care provider. What should I know about hormone replacement therapy and supplements? Hormone therapy products are effective for treating symptoms that are associated with menopause, such as hot flashes and night sweats. Hormone replacement carries certain risks, especially as you become older. If you are thinking about using estrogen or estrogen with progestin treatments, discuss the  benefits and risks with your health care provider. What should I know about heart disease and stroke? Heart disease, heart attack, and stroke become more likely as you age. This may be due, in part, to the hormonal changes that your body experiences during menopause. These can affect how your body processes dietary fats, triglycerides, and cholesterol. Heart attack and stroke are both medical emergencies. There are many things that you can do to help prevent heart disease and stroke:  Have your blood pressure checked at least every 1-2 years. High blood pressure causes heart disease and increases the risk of stroke.  If you are 28-2 years old, ask your health care provider if you should take aspirin to prevent a heart attack or a stroke.  Do not use any tobacco products, including cigarettes, chewing tobacco, or electronic cigarettes. If you need help quitting, ask your health care provider.  It is important to eat a healthy diet and maintain a healthy weight.  Be sure to include plenty of vegetables, fruits, low-fat dairy products, and lean protein.  Avoid eating foods that are high in solid fats, added sugars, or salt (sodium).  Get regular exercise. This is one of the most important things that you can do for your health.  Try to exercise for at least 150 minutes each week. The type of exercise that you do should increase your heart rate and make you sweat. This is known as moderate-intensity exercise.  Try to do strengthening exercises at least twice each week. Do these in addition to the moderate-intensity exercise.  Know your numbers.Ask your health care provider to check your cholesterol and your blood glucose. Continue to have your blood tested as directed by your  health care provider. What should I know about cancer screening? There are several types of cancer. Take the following steps to reduce your risk and to catch any cancer development as early as possible. Breast  Cancer  Practice breast self-awareness.  This means understanding how your breasts normally appear and feel.  It also means doing regular breast self-exams. Let your health care provider know about any changes, no matter how small.  If you are 47 or older, have a clinician do a breast exam (clinical breast exam or CBE) every year. Depending on your age, family history, and medical history, it may be recommended that you also have a yearly breast X-ray (mammogram).  If you have a family history of breast cancer, talk with your health care provider about genetic screening.  If you are at high risk for breast cancer, talk with your health care provider about having an MRI and a mammogram every year.  Breast cancer (BRCA) gene test is recommended for women who have family members with BRCA-related cancers. Results of the assessment will determine the need for genetic counseling and BRCA1 and for BRCA2 testing. BRCA-related cancers include these types:  Breast. This occurs in males or females.  Ovarian.  Tubal. This may also be called fallopian tube cancer.  Cancer of the abdominal or pelvic lining (peritoneal cancer).  Prostate.  Pancreatic. Cervical, Uterine, and Ovarian Cancer  Your health care provider may recommend that you be screened regularly for cancer of the pelvic organs. These include your ovaries, uterus, and vagina. This screening involves a pelvic exam, which includes checking for microscopic changes to the surface of your cervix (Pap test).  For women ages 21-65, health care providers may recommend a pelvic exam and a Pap test every three years. For women ages 68-65, they may recommend the Pap test and pelvic exam, combined with testing for human papilloma virus (HPV), every five years. Some types of HPV increase your risk of cervical cancer. Testing for HPV may also be done on women of any age who have unclear Pap test results.  Other health care providers may not  recommend any screening for nonpregnant women who are considered low risk for pelvic cancer and have no symptoms. Ask your health care provider if a screening pelvic exam is right for you.  If you have had past treatment for cervical cancer or a condition that could lead to cancer, you need Pap tests and screening for cancer for at least 20 years after your treatment. If Pap tests have been discontinued for you, your risk factors (such as having a new sexual partner) need to be reassessed to determine if you should start having screenings again. Some women have medical problems that increase the chance of getting cervical cancer. In these cases, your health care provider may recommend that you have screening and Pap tests more often.  If you have a family history of uterine cancer or ovarian cancer, talk with your health care provider about genetic screening.  If you have vaginal bleeding after reaching menopause, tell your health care provider.  There are currently no reliable tests available to screen for ovarian cancer. Lung Cancer  Lung cancer screening is recommended for adults 52-26 years old who are at high risk for lung cancer because of a history of smoking. A yearly low-dose CT scan of the lungs is recommended if you:  Currently smoke.  Have a history of at least 30 pack-years of smoking and you currently smoke or have quit  within the past 15 years. A pack-year is smoking an average of one pack of cigarettes per day for one year. Yearly screening should:  Continue until it has been 15 years since you quit.  Stop if you develop a health problem that would prevent you from having lung cancer treatment. Colorectal Cancer  This type of cancer can be detected and can often be prevented.  Routine colorectal cancer screening usually begins at age 46 and continues through age 24.  If you have risk factors for colon cancer, your health care provider may recommend that you be screened at an  earlier age.  If you have a family history of colorectal cancer, talk with your health care provider about genetic screening.  Your health care provider may also recommend using home test kits to check for hidden blood in your stool.  A small camera at the end of a tube can be used to examine your colon directly (sigmoidoscopy or colonoscopy). This is done to check for the earliest forms of colorectal cancer.  Direct examination of the colon should be repeated every 5-10 years until age 82. However, if early forms of precancerous polyps or small growths are found or if you have a family history or genetic risk for colorectal cancer, you may need to be screened more often. Skin Cancer  Check your skin from head to toe regularly.  Monitor any moles. Be sure to tell your health care provider:  About any new moles or changes in moles, especially if there is a change in a mole's shape or color.  If you have a mole that is larger than the size of a pencil eraser.  If any of your family members has a history of skin cancer, especially at a young age, talk with your health care provider about genetic screening.  Always use sunscreen. Apply sunscreen liberally and repeatedly throughout the day.  Whenever you are outside, protect yourself by wearing long sleeves, pants, a wide-brimmed hat, and sunglasses. What should I know about osteoporosis? Osteoporosis is a condition in which bone destruction happens more quickly than new bone creation. After menopause, you may be at an increased risk for osteoporosis. To help prevent osteoporosis or the bone fractures that can happen because of osteoporosis, the following is recommended:  If you are 76-44 years old, get at least 1,000 mg of calcium and at least 600 mg of vitamin D per day.  If you are older than age 43 but younger than age 7, get at least 1,200 mg of calcium and at least 600 mg of vitamin D per day.  If you are older than age 17, get at  least 1,200 mg of calcium and at least 800 mg of vitamin D per day. Smoking and excessive alcohol intake increase the risk of osteoporosis. Eat foods that are rich in calcium and vitamin D, and do weight-bearing exercises several times each week as directed by your health care provider. What should I know about how menopause affects my mental health? Depression may occur at any age, but it is more common as you become older. Common symptoms of depression include:  Low or sad mood.  Changes in sleep patterns.  Changes in appetite or eating patterns.  Feeling an overall lack of motivation or enjoyment of activities that you previously enjoyed.  Frequent crying spells. Talk with your health care provider if you think that you are experiencing depression. What should I know about immunizations? It is important that you get  and maintain your immunizations. These include:  Tetanus, diphtheria, and pertussis (Tdap) booster vaccine.  Influenza every year before the flu season begins.  Pneumonia vaccine.  Shingles vaccine. Your health care provider may also recommend other immunizations. This information is not intended to replace advice given to you by your health care provider. Make sure you discuss any questions you have with your health care provider. Document Released: 04/05/2005 Document Revised: 09/01/2015 Document Reviewed: 11/15/2014 Elsevier Interactive Patient Education  2017 [employer].

## 2016-08-07 LAB — FECAL OCCULT BLOOD, IMMUNOCHEMICAL: FECAL OCCULT BLD: NEGATIVE

## 2016-11-29 ENCOUNTER — Ambulatory Visit
Admission: RE | Admit: 2016-11-29 | Discharge: 2016-11-29 | Disposition: A | Discharge status: Home / Self Care | Payer: Commercial Managed Care - PPO | Source: Ambulatory Visit | Attending: Obstetrics and Gynecology | Admitting: Obstetrics and Gynecology

## 2016-11-29 DIAGNOSIS — Z1231 Encounter for screening mammogram for malignant neoplasm of breast: Secondary | ICD-10-CM | POA: Diagnosis not present

## 2016-11-29 DIAGNOSIS — Z1239 Encounter for other screening for malignant neoplasm of breast: Secondary | ICD-10-CM

## 2017-02-12 ENCOUNTER — Other Ambulatory Visit: Payer: Self-pay | Admitting: Internal Medicine

## 2017-02-12 DIAGNOSIS — M545 Low back pain, unspecified: Secondary | ICD-10-CM

## 2017-02-21 ENCOUNTER — Ambulatory Visit
Admission: RE | Admit: 2017-02-21 | Discharge: 2017-02-21 | Disposition: A | Discharge status: Home / Self Care | Payer: Commercial Managed Care - PPO | Source: Ambulatory Visit | Attending: Internal Medicine | Admitting: Internal Medicine

## 2017-02-21 DIAGNOSIS — M5127 Other intervertebral disc displacement, lumbosacral region: Secondary | ICD-10-CM | POA: Insufficient documentation

## 2017-02-21 DIAGNOSIS — M5126 Other intervertebral disc displacement, lumbar region: Secondary | ICD-10-CM | POA: Insufficient documentation

## 2017-02-21 DIAGNOSIS — M545 Low back pain, unspecified: Secondary | ICD-10-CM

## 2017-02-25 HISTORY — PX: CAPSULAR RELEASE SHOULDER: SUR184

## 2017-05-23 ENCOUNTER — Other Ambulatory Visit: Payer: Self-pay | Admitting: Sports Medicine

## 2017-05-23 DIAGNOSIS — M7541 Impingement syndrome of right shoulder: Secondary | ICD-10-CM

## 2017-05-23 DIAGNOSIS — M25511 Pain in right shoulder: Principal | ICD-10-CM

## 2017-05-23 DIAGNOSIS — G8929 Other chronic pain: Secondary | ICD-10-CM

## 2017-06-17 ENCOUNTER — Ambulatory Visit
Admission: RE | Admit: 2017-06-17 | Discharge: 2017-06-17 | Disposition: A | Discharge status: Home / Self Care | Payer: Commercial Managed Care - PPO | Source: Ambulatory Visit | Attending: Sports Medicine | Admitting: Sports Medicine

## 2017-06-17 DIAGNOSIS — M25511 Pain in right shoulder: Secondary | ICD-10-CM | POA: Diagnosis present

## 2017-06-17 DIAGNOSIS — M65811 Other synovitis and tenosynovitis, right shoulder: Secondary | ICD-10-CM | POA: Insufficient documentation

## 2017-06-17 DIAGNOSIS — G8929 Other chronic pain: Secondary | ICD-10-CM | POA: Insufficient documentation

## 2017-06-17 DIAGNOSIS — M7541 Impingement syndrome of right shoulder: Secondary | ICD-10-CM | POA: Diagnosis present

## 2017-07-14 NOTE — Progress Notes (Signed)
GYN ANNUAL PREVENTATIVE CARE ENCOUNTER NOTE  Subjective:       Sharon Lewis is a 57 y.o. G53P2002 female here for a routine annual gynecologic exam.    Complaints: None  History of breast cancer and ovarian cancer in sister. Patient is status post TVH and laparoscopic BSO.  Bowel and bladder function are normal.  No need for splinting with bowel movements. Sharon Lewis has not experienced any significant hot flashes since her laparoscopic BSO. She is not complaining of any significant vaginal dryness.  Gynecologic History No LMP recorded. Patient has had a TVH; status post laparoscopic BSO. Contraception: status post hysterectomy Last Pap: 06/24/2013 negative/negative Last mammogram: 11/2016  BI-RADS 1 2018-Patient has not had genetic testing; nor has her sister had genetic testing. There are numerous family members on the paternal side who have had breast and colon cancer. The patient does have a daughter. She is to contemplate genetic testing; information is given.  Obstetric History OB History  Gravida Para Term Preterm AB Living  SAB TAB Ectopic Multiple Live Births          2    # Outcome Date GA Lbr Len/2nd Weight Sex Delivery Anes PTL Lv  2 Term      Vag-Spont   LIV  1 Term      Vag-Spont   LIV    Past Medical History:  Diagnosis Date  . Asthma   . Atypical chest pain   . Bulging of cervical intervertebral disc   . Cystocele   . Endometriosis   . FH: breast cancer   . FH: osteoporosis   . FH: ovarian cancer   . Fibrocystic breast   . Hypertriglyceridemia   . Increased BMI   . Menopausal state   . Migraines   . Rectocele   . Seasonal allergies   . Stress incontinence   . Vaginal atrophy   . Vitamin B 12 deficiency     Past Surgical History:  Procedure Laterality Date  . APPENDECTOMY    . BILATERAL SALPINGOOPHORECTOMY  2009  . LAPAROSCOPY    . VAGINAL HYSTERECTOMY      Current Outpatient Medications on File Prior to Visit  Medication Sig  Dispense Refill  . Calcium Carb-Cholecalciferol (CALCIUM 1000 + D PO) Take by mouth.    . cyanocobalamin (,VITAMIN B-12,) 1000 MCG/ML injection Inject 1,000 mcg into the muscle once.    . Multiple Vitamins-Minerals (MULTIVITAMIN & MINERAL PO) Take by mouth.     No current facility-administered medications on file prior to visit.     Allergies  Allergen Reactions  . Levaquin [Levofloxacin In D5w] Nausea Only    Social History   Socioeconomic History  . Marital status: Married    Spouse name: Not on file  . Number of children: Not on file  . Years of education: Not on file  . Highest education level: Not on file  Occupational History  . Not on file  Social Needs  . Financial resource strain: Not on file  . Food insecurity:    Worry: Not on file    Inability: Not on file  . Transportation needs:    Medical: Not on file    Non-medical: Not on file  Tobacco Use  . Smoking status: Never Smoker  . Smokeless tobacco: Never Used  Substance and Sexual Activity  . Alcohol use: Yes    Comment: rare  . Drug use: No  . Sexual activity:  Yes    Birth control/protection: Surgical  Lifestyle  . Physical activity:    Days per week: Not on file    Minutes per session: Not on file  . Stress: Not on file  Relationships  . Social connections:    Talks on phone: Not on file    Gets together: Not on file    Attends religious service: Not on file    Active member of club or organization: Not on file    Attends meetings of clubs or organizations: Not on file    Relationship status: Not on file  . Intimate partner violence:    Fear of current or ex partner: Not on file    Emotionally abused: Not on file    Physically abused: Not on file    Forced sexual activity: Not on file  Other Topics Concern  . Not on file  Social History Narrative  . Not on file    Family History  Problem Relation Age of Onset  . Breast cancer Sister 59  . Ovarian cancer Sister   . Diabetes Sister   .  Breast cancer Maternal Aunt 70       68  . Breast cancer Cousin   . Osteoporosis Mother   . Parkinson's disease Mother   . Diabetes Father   . Heart disease Father   . Colon cancer Paternal Uncle     The following portions of the patient's history were reviewed and updated as appropriate: allergies, current medications, past family history, past medical history, past social history, past surgical history and problem list.  Review of Systems Review of Systems  Constitutional: Negative.   HENT: Negative.   Eyes: Negative.   Respiratory: Negative.   Cardiovascular: Negative.   Gastrointestinal: Negative.  Negative for constipation.       No splinting for bowel movements  Genitourinary: Negative.   Musculoskeletal: Positive for joint pain and neck pain.       Right shoulder-tendinosis, with ongoing physical therapy and evaluation Upper back vertebral disc narrowing; patient sees Dr. Yves Dill in the pain clinic  Skin: Negative.     OBJECTIVE: BP 133/68   Pulse 80   Ht  (1.676 m)   Wt 204 lb 4.8 oz (92.7 kg)   BMI 32.97 kg/m    Physical Exam  Constitutional: She is oriented to person, place, and time. She appears well-developed and well-nourished.  HENT:  Head: Normocephalic and atraumatic.  Eyes: Conjunctivae and EOM are normal. No scleral icterus.  Neck: Normal range of motion. Neck supple. No thyromegaly present.  Cardiovascular: Normal rate, regular rhythm and normal heart sounds.  No murmur heard. Pulmonary/Chest: Effort normal and breath sounds normal.  Abdominal: Soft. She exhibits no distension and no mass. There is no tenderness. No hernia.  Genitourinary:  Genitourinary Comments: External genitalia-Normal BUS normal Vagina-first-degree cystocele; moderate rectocele; good apical support; mild decreased estrogen effect Cervix-surgically absent Uterus-surgically absent  adnexa-nonpalpable nontender Rectovaginal-external hemorrhoids, nonthrombosed; normal  sphincter tone; no rectal masses  Musculoskeletal: She exhibits no edema or tenderness.  Lymphadenopathy:    She has no cervical adenopathy.  Neurological: She is alert and oriented to person, place, and time.  Skin: Skin is warm and dry.  Psychiatric: Her behavior is normal.    Assessment:   Annual gynecologic examination 57 y.o. Contraception: status post hysterectomy [id number] Patient Active Problem List   Diagnosis Date Noted  . Endometriosis 07/04/2015  . Status post vaginal hysterectomy 07/04/2015  . S/P BSO (status post  bilateral salpingo-oophorectomy) 07/04/2015  . Family history of breast cancer in sister 07/04/2015  . Family history of ovarian cancer 07/04/2015  . Seasonal allergies 07/04/2015  . Increased BMI 07/04/2015  . Cystocele 07/04/2015  . Rectocele 07/04/2015   Family history ovarian cancer and breast cancer    Plan:  Pap: Not needed Mammogram: Ordered  Stool cards- ordered  Labs: per primary care Routine preventative health maintenance measures emphasized: Diet/Weight control, Tobacco Cessation and Alcohol/Drug use Continue calcium and vitamin D supplementation Return to Clinic - 1 Year  [place], CMA  Herold Harms, MD  Note: This dictation was prepared with Dragon dictation along with smaller phrase technology. Any transcriptional errors that result from this process are unintentional.

## 2017-07-16 ENCOUNTER — Ambulatory Visit (INDEPENDENT_AMBULATORY_CARE_PROVIDER_SITE_OTHER): Payer: Commercial Managed Care - PPO | Admitting: Obstetrics and Gynecology

## 2017-07-16 ENCOUNTER — Encounter: Payer: Self-pay | Admitting: Obstetrics and Gynecology

## 2017-07-16 VITALS — BP 133/68 | HR 80 | Ht 66.0 in | Wt 204.3 lb

## 2017-07-16 DIAGNOSIS — Z01419 Encounter for gynecological examination (general) (routine) without abnormal findings: Secondary | ICD-10-CM | POA: Diagnosis not present

## 2017-07-16 DIAGNOSIS — Z1231 Encounter for screening mammogram for malignant neoplasm of breast: Secondary | ICD-10-CM

## 2017-07-16 DIAGNOSIS — N809 Endometriosis, unspecified: Secondary | ICD-10-CM | POA: Diagnosis not present

## 2017-07-16 DIAGNOSIS — Z1239 Encounter for other screening for malignant neoplasm of breast: Secondary | ICD-10-CM

## 2017-07-16 DIAGNOSIS — Z803 Family history of malignant neoplasm of breast: Secondary | ICD-10-CM

## 2017-07-16 DIAGNOSIS — Z1211 Encounter for screening for malignant neoplasm of colon: Secondary | ICD-10-CM | POA: Diagnosis not present

## 2017-07-16 DIAGNOSIS — R638 Other symptoms and signs concerning food and fluid intake: Secondary | ICD-10-CM

## 2017-07-16 DIAGNOSIS — Z90710 Acquired absence of both cervix and uterus: Secondary | ICD-10-CM | POA: Diagnosis not present

## 2017-07-16 DIAGNOSIS — Z8041 Family history of malignant neoplasm of ovary: Secondary | ICD-10-CM

## 2017-07-16 DIAGNOSIS — Z90722 Acquired absence of ovaries, bilateral: Secondary | ICD-10-CM

## 2017-07-16 NOTE — Patient Instructions (Signed)
1.  Pap smear is not done. 2.  Mammogram is ordered 3.  Stool guaiac cards are given for colon cancer screening 4.  Screening labs are obtained through primary care 5.  Continue with healthy eating, exercise, and control weight loss at 1 pound per month target 6.  Continue with calcium 600 mg twice a day and vitamin D 400 units twice a day 7.  Consider DEXA scan testing for osteoporosis, followed by consideration of Evista therapy (raloxifene). 8.  Return in 1 year for annual exam  (Eviista) raloxifene tablets What is this medicine? RALOXIFENE (ral OX i feen) reduces the amount of calcium lost from bones. It is used to treat and prevent osteoporosis in women who have experienced menopause. It may also help prevent invasive breast cancer in certain women who have a high risk for breast cancer. This medicine may be used for other purposes; ask your health care provider or pharmacist if you have questions. COMMON BRAND NAME(S): Evista What should I tell my health care provider before I take this medicine? They need to know if you have any of these conditions: -a history of blood clots -cancer -heart disease or recent heart attack -high levels of triglycerides (blood fat) in the blood -history of stroke -kidney disease -liver disease -premenopausal -smoke tobacco -an unusual or allergic reaction to raloxifene, other medicines, foods, dyes, or preservatives -pregnant or trying to get pregnant -breast-feeding How should I use this medicine? Take this medicine by mouth with a glass of water. Follow the directions on the prescription label. The tablets can be taken with or without food. Take your doses at regular intervals. Do not take your medicine more often than directed. A special MedGuide will be given to you by the pharmacist with each prescription and refill. Be sure to read this information carefully each time. Talk to your pediatrician regarding the use of this medicine in children.  Special care may be needed. Overdosage: If you think you have taken too much of this medicine contact a poison control center or emergency room at once. NOTE: This medicine is only for you. Do not share this medicine with others. What if I miss a dose? If you miss a dose, take it as soon as you can. If it is almost time for your next dose, take only that dose. Do not take double or extra doses. What may interact with this medicine? -cholestyramine -female hormones, like estrogens -warfarin This list may not describe all possible interactions. Give your health care provider a list of all the medicines, herbs, non-prescription drugs, or dietary supplements you use. Also tell them if you smoke, drink alcohol, or use illegal drugs. Some items may interact with your medicine. What should I watch for while using this medicine? Visit your doctor or health care professional for regular checks on your progress. Do not stop taking this medicine except on the advice of your doctor or health care professional. If you are taking this medicine to reduce your risk of getting breast cancer, you should know that this medicine does not prevent all types of breast cancer. Talk to your doctor if you have questions. This medicine does not prevent hot flashes. It may cause hot flashes in some patients at the start of therapy. You should make sure that you get enough calcium and vitamin D while you are taking this medicine. Discuss the foods you eat and the vitamins you take with your health care professional. Exercise may help to prevent bone loss.  Discuss your exercise needs with your doctor or health care professional. This medicine can rarely cause blood clots. If you are going to have surgery, tell your doctor or health care professional that you are taking this medicine. This medicine should be stopped at least 3 days before surgery. After surgery, it should be restarted only after you are walking again. It should  not be restarted while you still need long periods of bed rest. You should not smoke while taking this medicine. Smoking may increase your risk of blood clots or stroke. If you have any reason to think you are pregnant; stop taking this medicine at once and contact your doctor or health care professional. Do not breast feed while taking this medicine. What side effects may I notice from receiving this medicine? Side effects that you should report to your doctor or health care professional as soon as possible: -allergic reactions like skin rash, itching or hives, swelling of the face, lips, or tongue) -breast tissue changes or discharge -signs and symptoms of a blood clot such as breathing problems; changes in vision; chest pain; severe, sudden headache; pain, swelling, warmth in the leg; trouble speaking; sudden numbness or weakness of the face, arm or leg -signs and symptoms of a stroke like changes in vision; confusion; trouble speaking or understanding; severe headaches; sudden numbness or weakness of the face, arm or leg; trouble walking; dizziness; loss of balance or coordination -vaginal discharge that is bloody, brown, or rust Side effects that usually do not require medical attention (report to your doctor or health care professional if they continue or are bothersome): -hot flashes -joint pain -leg cramps -sweating -swelling of the ankles, feet, hands This list may not describe all possible side effects. Call your doctor for medical advice about side effects. You may report side effects to FDA at 1-800-FDA-1088. Where should I keep my medicine? Keep out of the reach of children. Store at room temperature between 15 and 30 degrees C (59 and 86 degrees F). Throw away any unused medicine after the expiration date. NOTE: This sheet is a summary. It may not cover all possible information. If you have questions about this medicine, talk to your doctor, pharmacist, or health care provider.   2018 Elsevier/Gold Standard (2016-03-20 17:15:34)  Health Maintenance for Postmenopausal Women Menopause is a normal process in which your reproductive ability comes to an end. This process happens gradually over a span of months to years, usually between the ages of 107 and 49. Menopause is complete when you have missed 12 consecutive menstrual periods. It is important to talk with your health care provider about some of the most common conditions that affect postmenopausal women, such as heart disease, cancer, and bone loss (osteoporosis). Adopting a healthy lifestyle and getting preventive care can help to promote your health and wellness. Those actions can also lower your chances of developing some of these common conditions. What should I know about menopause? During menopause, you may experience a number of symptoms, such as:  Moderate-to-severe hot flashes.  Night sweats.  Decrease in sex drive.  Mood swings.  Headaches.  Tiredness.  Irritability.  Memory problems.  Insomnia.  Choosing to treat or not to treat menopausal changes is an individual decision that you make with your health care provider. What should I know about hormone replacement therapy and supplements? Hormone therapy products are effective for treating symptoms that are associated with menopause, such as hot flashes and night sweats. Hormone replacement carries certain risks, especially  as you become older. If you are thinking about using estrogen or estrogen with progestin treatments, discuss the benefits and risks with your health care provider. What should I know about heart disease and stroke? Heart disease, heart attack, and stroke become more likely as you age. This may be due, in part, to the hormonal changes that your body experiences during menopause. These can affect how your body processes dietary fats, triglycerides, and cholesterol. Heart attack and stroke are both medical emergencies. There are many  things that you can do to help prevent heart disease and stroke:  Have your blood pressure checked at least every 1-2 years. High blood pressure causes heart disease and increases the risk of stroke.  If you are 1-42 years old, ask your health care provider if you should take aspirin to prevent a heart attack or a stroke.  Do not use any tobacco products, including cigarettes, chewing tobacco, or electronic cigarettes. If you need help quitting, ask your health care provider.  It is important to eat a healthy diet and maintain a healthy weight. ? Be sure to include plenty of vegetables, fruits, low-fat dairy products, and lean protein. ? Avoid eating foods that are high in solid fats, added sugars, or salt (sodium).  Get regular exercise. This is one of the most important things that you can do for your health. ? Try to exercise for at least 150 minutes each week. The type of exercise that you do should increase your heart rate and make you sweat. This is known as moderate-intensity exercise. ? Try to do strengthening exercises at least twice each week. Do these in addition to the moderate-intensity exercise.  Know your numbers.Ask your health care provider to check your cholesterol and your blood glucose. Continue to have your blood tested as directed by your health care provider.  What should I know about cancer screening? There are several types of cancer. Take the following steps to reduce your risk and to catch any cancer development as early as possible. Breast Cancer  Practice breast self-awareness. ? This means understanding how your breasts normally appear and feel. ? It also means doing regular breast self-exams. Let your health care provider know about any changes, no matter how small.  If you are 51 or older, have a clinician do a breast exam (clinical breast exam or CBE) every year. Depending on your age, family history, and medical history, it may be recommended that you  also have a yearly breast X-ray (mammogram).  If you have a family history of breast cancer, talk with your health care provider about genetic screening.  If you are at high risk for breast cancer, talk with your health care provider about having an MRI and a mammogram every year.  Breast cancer (BRCA) gene test is recommended for women who have family members with BRCA-related cancers. Results of the assessment will determine the need for genetic counseling and BRCA1 and for BRCA2 testing. BRCA-related cancers include these types: ? Breast. This occurs in males or females. ? Ovarian. ? Tubal. This may also be called fallopian tube cancer. ? Cancer of the abdominal or pelvic lining (peritoneal cancer). ? Prostate. ? Pancreatic.  Cervical, Uterine, and Ovarian Cancer Your health care provider may recommend that you be screened regularly for cancer of the pelvic organs. These include your ovaries, uterus, and vagina. This screening involves a pelvic exam, which includes checking for microscopic changes to the surface of your cervix (Pap test).  For women ages  21-65, health care providers may recommend a pelvic exam and a Pap test every three years. For women ages 79-65, they may recommend the Pap test and pelvic exam, combined with testing for human papilloma virus (HPV), every five years. Some types of HPV increase your risk of cervical cancer. Testing for HPV may also be done on women of any age who have unclear Pap test results.  Other health care providers may not recommend any screening for nonpregnant women who are considered low risk for pelvic cancer and have no symptoms. Ask your health care provider if a screening pelvic exam is right for you.  If you have had past treatment for cervical cancer or a condition that could lead to cancer, you need Pap tests and screening for cancer for at least 20 years after your treatment. If Pap tests have been discontinued for you, your risk factors  (such as having a new sexual partner) need to be reassessed to determine if you should start having screenings again. Some women have medical problems that increase the chance of getting cervical cancer. In these cases, your health care provider may recommend that you have screening and Pap tests more often.  If you have a family history of uterine cancer or ovarian cancer, talk with your health care provider about genetic screening.  If you have vaginal bleeding after reaching menopause, tell your health care provider.  There are currently no reliable tests available to screen for ovarian cancer.  Lung Cancer Lung cancer screening is recommended for adults 70-9 years old who are at high risk for lung cancer because of a history of smoking. A yearly low-dose CT scan of the lungs is recommended if you:  Currently smoke.  Have a history of at least 30 pack-years of smoking and you currently smoke or have quit within the past 15 years. A pack-year is smoking an average of one pack of cigarettes per day for one year.  Yearly screening should:  Continue until it has been 15 years since you quit.  Stop if you develop a health problem that would prevent you from having lung cancer treatment.  Colorectal Cancer  This type of cancer can be detected and can often be prevented.  Routine colorectal cancer screening usually begins at age 97 and continues through age 60.  If you have risk factors for colon cancer, your health care provider may recommend that you be screened at an earlier age.  If you have a family history of colorectal cancer, talk with your health care provider about genetic screening.  Your health care provider may also recommend using home test kits to check for hidden blood in your stool.  A small camera at the end of a tube can be used to examine your colon directly (sigmoidoscopy or colonoscopy). This is done to check for the earliest forms of colorectal cancer.  Direct  examination of the colon should be repeated every 5-10 years until age 5. However, if early forms of precancerous polyps or small growths are found or if you have a family history or genetic risk for colorectal cancer, you may need to be screened more often.  Skin Cancer  Check your skin from head to toe regularly.  Monitor any moles. Be sure to tell your health care provider: ? About any new moles or changes in moles, especially if there is a change in a mole's shape or color. ? If you have a mole that is larger than the size of a  pencil eraser.  If any of your family members has a history of skin cancer, especially at a young age, talk with your health care provider about genetic screening.  Always use sunscreen. Apply sunscreen liberally and repeatedly throughout the day.  Whenever you are outside, protect yourself by wearing long sleeves, pants, a wide-brimmed hat, and sunglasses.  What should I know about osteoporosis? Osteoporosis is a condition in which bone destruction happens more quickly than new bone creation. After menopause, you may be at an increased risk for osteoporosis. To help prevent osteoporosis or the bone fractures that can happen because of osteoporosis, the following is recommended:  If you are 82-92 years old, get at least 1,000 mg of calcium and at least 600 mg of vitamin D per day.  If you are older than age 27 but younger than age 40, get at least 1,200 mg of calcium and at least 600 mg of vitamin D per day.  If you are older than age 39, get at least 1,200 mg of calcium and at least 800 mg of vitamin D per day.  Smoking and excessive alcohol intake increase the risk of osteoporosis. Eat foods that are rich in calcium and vitamin D, and do weight-bearing exercises several times each week as directed by your health care provider. What should I know about how menopause affects my mental health? Depression may occur at any age, but it is more common as you become  older. Common symptoms of depression include:  Low or sad mood.  Changes in sleep patterns.  Changes in appetite or eating patterns.  Feeling an overall lack of motivation or enjoyment of activities that you previously enjoyed.  Frequent crying spells.  Talk with your health care provider if you think that you are experiencing depression. What should I know about immunizations? It is important that you get and maintain your immunizations. These include:  Tetanus, diphtheria, and pertussis (Tdap) booster vaccine.  Influenza every year before the flu season begins.  Pneumonia vaccine.  Shingles vaccine.  Your health care provider may also recommend other immunizations. This information is not intended to replace advice given to you by your health care provider. Make sure you discuss any questions you have with your health care provider. Document Released: 04/05/2005 Document Revised: 09/01/2015 Document Reviewed: 11/15/2014 Elsevier Interactive Patient Education  2018 [employer].

## 2017-12-17 ENCOUNTER — Ambulatory Visit
Admission: RE | Admit: 2017-12-17 | Discharge: 2017-12-17 | Disposition: A | Discharge status: Home / Self Care | Payer: Commercial Managed Care - PPO | Source: Ambulatory Visit | Attending: Obstetrics and Gynecology | Admitting: Obstetrics and Gynecology

## 2017-12-17 DIAGNOSIS — Z1239 Encounter for other screening for malignant neoplasm of breast: Secondary | ICD-10-CM | POA: Diagnosis not present

## 2018-07-21 ENCOUNTER — Encounter: Payer: Commercial Managed Care - PPO | Admitting: Obstetrics and Gynecology

## 2018-07-31 ENCOUNTER — Other Ambulatory Visit: Payer: Self-pay

## 2018-07-31 ENCOUNTER — Ambulatory Visit (INDEPENDENT_AMBULATORY_CARE_PROVIDER_SITE_OTHER): Payer: Commercial Managed Care - PPO | Admitting: Obstetrics and Gynecology

## 2018-07-31 ENCOUNTER — Encounter: Payer: Self-pay | Admitting: Obstetrics and Gynecology

## 2018-07-31 VITALS — BP 131/76 | HR 66 | Ht 66.0 in | Wt 201.9 lb

## 2018-07-31 DIAGNOSIS — Z01419 Encounter for gynecological examination (general) (routine) without abnormal findings: Secondary | ICD-10-CM

## 2018-07-31 DIAGNOSIS — Z1239 Encounter for other screening for malignant neoplasm of breast: Secondary | ICD-10-CM | POA: Diagnosis not present

## 2018-07-31 DIAGNOSIS — Z780 Asymptomatic menopausal state: Secondary | ICD-10-CM

## 2018-07-31 NOTE — Addendum Note (Signed)
Addended by: Dorian Pod on: 07/31/2018 11:43 AM   Modules accepted: Orders

## 2018-07-31 NOTE — Progress Notes (Signed)
Patient comes in today for annual physical. No concerns today. PCP does the patient labs. She is due for mammogram in 11/2018. Does not need PAP due to having hysterectomy.

## 2018-07-31 NOTE — Progress Notes (Signed)
HPI:      [name] is a 58 y.o. [phone] who LMP was No LMP recorded. Patient has had a hysterectomy.  Subjective:   She presents today for her annual examination.  She has no complaints.  She reports no vaginal dryness or other symptoms. She reports that after her shoulder surgery she is doing much better. She has a family history of a sister who had breast and ovarian cancer.  The patient has considered genetic testing but has not had it performed.  She plans to check with her sister to see if her sister was genetically positive before deciding upon further testing. Patient has not been regularly taking her calcium and vitamin D.    Hx: The following portions of the patient's history were reviewed and updated as appropriate:             She  has a past medical history of Asthma, Atypical chest pain, Bulging of cervical intervertebral disc, Cystocele, Endometriosis, FH: breast cancer, FH: osteoporosis, FH: ovarian cancer, Fibrocystic breast, Hypertriglyceridemia, Increased BMI, Menopausal state, Migraines, Rectocele, Seasonal allergies, Stress incontinence, Vaginal atrophy, and Vitamin B 12 deficiency. She does not have any pertinent problems on file. She  has a past surgical history that includes Bilateral salpingoophorectomy (2009); Vaginal hysterectomy; Appendectomy; laparoscopy; and Oophorectomy. Her family history includes Breast cancer in her cousin and cousin; Breast cancer (age of onset: 64) in her sister; Breast cancer (age of onset: 66) in her maternal aunt; Colon cancer in her paternal uncle; Diabetes in her father and sister; Heart disease in her father; Osteoporosis in her mother; Ovarian cancer in her sister; Parkinson's disease in her mother. She  reports that she has never smoked. She has never used smokeless tobacco. She reports current alcohol use. She reports that she does not use drugs. She has a current medication list which includes the following prescription(s):  calcium carb-cholecalciferol, cyanocobalamin, and multiple vitamins-minerals. She is allergic to levaquin [levofloxacin in d5w].       Review of Systems:  Review of Systems  Constitutional: Denied constitutional symptoms, night sweats, recent illness, fatigue, fever, insomnia and weight loss.  Eyes: Denied eye symptoms, eye pain, photophobia, vision change and visual disturbance.  Ears/Nose/Throat/Neck: Denied ear, nose, throat or neck symptoms, hearing loss, nasal discharge, sinus congestion and sore throat.  Cardiovascular: Denied cardiovascular symptoms, arrhythmia, chest pain/pressure, edema, exercise intolerance, orthopnea and palpitations.  Respiratory: Denied pulmonary symptoms, asthma, pleuritic pain, productive sputum, cough, dyspnea and wheezing.  Gastrointestinal: Denied, gastro-esophageal reflux, melena, nausea and vomiting.  Genitourinary: Denied genitourinary symptoms including symptomatic vaginal discharge, pelvic relaxation issues, and urinary complaints.  Musculoskeletal: Denied musculoskeletal symptoms, stiffness, swelling, muscle weakness and myalgia.  Dermatologic: Denied dermatology symptoms, rash and scar.  Neurologic: Denied neurology symptoms, dizziness, headache, neck pain and syncope.  Psychiatric: Denied psychiatric symptoms, anxiety and depression.  Endocrine: Denied endocrine symptoms including hot flashes and night sweats.   Meds:   Current Outpatient Medications on File Prior to Visit  Medication Sig Dispense Refill  . Calcium Carb-Cholecalciferol (CALCIUM 1000 + D PO) Take by mouth.    . cyanocobalamin (,VITAMIN B-12,) 1000 MCG/ML injection Inject 1,000 mcg into the muscle once.    . Multiple Vitamins-Minerals (MULTIVITAMIN & MINERAL PO) Take by mouth.     No current facility-administered medications on file prior to visit.     Objective:     Vitals:   07/31/18 1100  BP: 131/76  Pulse: 66  Physical examination General NAD, Conversant   HEENT Atraumatic; Op clear with mmm.  Normo-cephalic. Pupils reactive. Anicteric sclerae  Thyroid/Neck Smooth without nodularity or enlargement. Normal ROM.  Neck Supple.  Skin No rashes, lesions or ulceration. Normal palpated skin turgor. No nodularity.  Breasts: No masses or discharge.  Symmetric.  No axillary adenopathy.  Lungs: Clear to auscultation.No rales or wheezes. Normal Respiratory effort, no retractions.  Heart: NSR.  No murmurs or rubs appreciated. No periferal edema  Abdomen: Soft.  Non-tender.  No masses.  No HSM. No hernia  Extremities: Moves all appropriately.  Normal ROM for age. No lymphadenopathy.  Neuro: Oriented to PPT.  Normal mood. Normal affect.     Pelvic:   Vulva: Normal appearance.  No lesions.   Vagina: No lesions or abnormalities noted.  Support: Normal pelvic support.  Urethra No masses tenderness or scarring.  Meatus Normal size without lesions or prolapse.  Cervix: Surgically absent   Anus: Normal exam.  No lesions.  Perineum: Normal exam.  No lesions.        Bimanual   Uterus: Surgically absent   Adnexae: No masses.  Non-tender to palpation.  Cul-de-sac: Negative for abnormality.     Assessment:    [id number] Patient Active Problem List   Diagnosis Date Noted  . Endometriosis 07/04/2015  . Status post vaginal hysterectomy 07/04/2015  . S/P BSO (status post bilateral salpingo-oophorectomy) 07/04/2015  . Family history of breast cancer in sister 07/04/2015  . Family history of ovarian cancer 07/04/2015  . Seasonal allergies 07/04/2015  . Increased BMI 07/04/2015  . Cystocele 07/04/2015  . Rectocele 07/04/2015     1. Well woman exam with routine gynecological exam     Her exam is normal today.   Plan:            1.  Basic Screening Recommendations The basic screening recommendations for asymptomatic women were discussed with the patient during her visit.  The age-appropriate recommendations were discussed with her and the rational for  the tests reviewed.  When I am informed by the patient that another primary care physician has previously obtained the age-appropriate tests and they are up-to-date, only outstanding tests are ordered and referrals given as necessary.  Abnormal results of tests will be discussed with her when all of her results are completed. Mammogram and DEXA ordered.  PCP performs routine blood work and the patient would like to continue that.  She will ask her sister regarding genetic predisposition for cancer before she definitively decides to have genetic testing performed. Orders No orders of the defined types were placed in this encounter.   No orders of the defined types were placed in this encounter.       F/U  Return in about 1 year (around 07/31/2019) for Annual Physical.  Elonda Huskyavid J. Keelia Graybill, M.D. 07/31/2018 11:36 AM

## 2018-11-30 ENCOUNTER — Other Ambulatory Visit: Payer: Self-pay

## 2018-11-30 DIAGNOSIS — Z20822 Contact with and (suspected) exposure to covid-19: Secondary | ICD-10-CM

## 2018-12-02 LAB — NOVEL CORONAVIRUS, NAA: SARS-CoV-2, NAA: NOT DETECTED

## 2019-01-13 ENCOUNTER — Ambulatory Visit
Admission: RE | Admit: 2019-01-13 | Discharge: 2019-01-13 | Disposition: A | Discharge status: Home / Self Care | Payer: Commercial Managed Care - PPO | Source: Ambulatory Visit | Attending: Obstetrics and Gynecology | Admitting: Obstetrics and Gynecology

## 2019-01-13 DIAGNOSIS — Z780 Asymptomatic menopausal state: Secondary | ICD-10-CM

## 2019-01-13 DIAGNOSIS — Z13820 Encounter for screening for osteoporosis: Secondary | ICD-10-CM | POA: Diagnosis present

## 2019-01-28 ENCOUNTER — Ambulatory Visit
Admission: RE | Admit: 2019-01-28 | Discharge: 2019-01-28 | Disposition: A | Discharge status: Home / Self Care | Payer: Commercial Managed Care - PPO | Source: Ambulatory Visit | Attending: Obstetrics and Gynecology | Admitting: Obstetrics and Gynecology

## 2019-01-28 DIAGNOSIS — Z1231 Encounter for screening mammogram for malignant neoplasm of breast: Secondary | ICD-10-CM | POA: Insufficient documentation

## 2019-01-28 DIAGNOSIS — Z1239 Encounter for other screening for malignant neoplasm of breast: Secondary | ICD-10-CM

## 2019-03-22 ENCOUNTER — Other Ambulatory Visit: Payer: Self-pay

## 2019-03-22 ENCOUNTER — Ambulatory Visit (LOCAL_COMMUNITY_HEALTH_CENTER): Payer: Self-pay

## 2019-03-22 DIAGNOSIS — Z111 Encounter for screening for respiratory tuberculosis: Secondary | ICD-10-CM

## 2019-03-25 ENCOUNTER — Ambulatory Visit (LOCAL_COMMUNITY_HEALTH_CENTER): Payer: Self-pay

## 2019-03-25 ENCOUNTER — Other Ambulatory Visit: Payer: Self-pay

## 2019-03-25 DIAGNOSIS — Z111 Encounter for screening for respiratory tuberculosis: Secondary | ICD-10-CM

## 2019-03-25 LAB — TB SKIN TEST
Induration: 0 mm
TB Skin Test: NEGATIVE

## 2019-08-06 ENCOUNTER — Encounter: Payer: Commercial Managed Care - PPO | Admitting: Obstetrics and Gynecology

## 2019-08-10 ENCOUNTER — Encounter: Payer: Self-pay | Admitting: Obstetrics and Gynecology

## 2019-08-10 ENCOUNTER — Ambulatory Visit (INDEPENDENT_AMBULATORY_CARE_PROVIDER_SITE_OTHER): Payer: 59 | Admitting: Obstetrics and Gynecology

## 2019-08-10 ENCOUNTER — Other Ambulatory Visit: Payer: Self-pay

## 2019-08-10 VITALS — BP 133/74 | HR 69 | Ht 66.0 in | Wt 187.3 lb

## 2019-08-10 DIAGNOSIS — Z1231 Encounter for screening mammogram for malignant neoplasm of breast: Secondary | ICD-10-CM | POA: Diagnosis not present

## 2019-08-10 DIAGNOSIS — Z803 Family history of malignant neoplasm of breast: Secondary | ICD-10-CM

## 2019-08-10 DIAGNOSIS — Z01419 Encounter for gynecological examination (general) (routine) without abnormal findings: Secondary | ICD-10-CM

## 2019-08-10 NOTE — Progress Notes (Signed)
HPI:      [name] is a 59 y.o. [phone] who LMP was No LMP recorded. Patient has had a hysterectomy.  Subjective:   She presents today for her annual examination.  She reports that she has been taking care of her invalid mother over the last year and it has been stressful for her. She has been intentionally losing weight with a diet and exercise and has lost 17 pounds! She has not further consider genetic testing for breast cancer.  Her sister had breast and primary ovarian cancer simultaneously.  Patient states she is still considering genetic testing.    Hx: The following portions of the patient's history were reviewed and updated as appropriate:             She  has a past medical history of Asthma, Atypical chest pain, Bulging of cervical intervertebral disc, Cystocele, Endometriosis, FH: breast cancer, FH: osteoporosis, FH: ovarian cancer, Fibrocystic breast, High cholesterol, Hypertriglyceridemia, Increased BMI, Menopausal state, Migraines, Rectocele, Seasonal allergies, Stress incontinence, Vaginal atrophy, and Vitamin B 12 deficiency. She does not have any pertinent problems on file. She  has a past surgical history that includes Bilateral salpingoophorectomy (2009); Vaginal hysterectomy; Appendectomy; laparoscopy; and Oophorectomy. Her family history includes Breast cancer in her cousin and cousin; Breast cancer (age of onset: 50) in her sister; Breast cancer (age of onset: 51) in her maternal aunt; Colon cancer in her paternal uncle; Diabetes in her father and sister; Heart disease in her father; Osteoporosis in her mother; Ovarian cancer in her sister; Parkinson's disease in her mother. She  reports that she has never smoked. She has never used smokeless tobacco. She reports current alcohol use. She reports that she does not use drugs. She has a current medication list which includes the following prescription(s): calcium carb-cholecalciferol, cyanocobalamin, and multiple  vitamins-minerals. She is allergic to levaquin [levofloxacin in d5w].       Review of Systems:  Review of Systems  Constitutional: Denied constitutional symptoms, night sweats, recent illness, fatigue, fever, insomnia and weight loss.  Eyes: Denied eye symptoms, eye pain, photophobia, vision change and visual disturbance.  Ears/Nose/Throat/Neck: Denied ear, nose, throat or neck symptoms, hearing loss, nasal discharge, sinus congestion and sore throat.  Cardiovascular: Denied cardiovascular symptoms, arrhythmia, chest pain/pressure, edema, exercise intolerance, orthopnea and palpitations.  Respiratory: Denied pulmonary symptoms, asthma, pleuritic pain, productive sputum, cough, dyspnea and wheezing.  Gastrointestinal: Denied, gastro-esophageal reflux, melena, nausea and vomiting.  Genitourinary: Denied genitourinary symptoms including symptomatic vaginal discharge, pelvic relaxation issues, and urinary complaints.  Musculoskeletal: Denied musculoskeletal symptoms, stiffness, swelling, muscle weakness and myalgia.  Dermatologic: Denied dermatology symptoms, rash and scar.  Neurologic: Denied neurology symptoms, dizziness, headache, neck pain and syncope.  Psychiatric: Denied psychiatric symptoms, anxiety and depression.  Endocrine: Denied endocrine symptoms including hot flashes and night sweats.   Meds:   Current Outpatient Medications on File Prior to Visit  Medication Sig Dispense Refill   Calcium Carb-Cholecalciferol (CALCIUM 1000 + D PO) Take by mouth.     cyanocobalamin (,VITAMIN B-12,) 1000 MCG/ML injection Inject 1,000 mcg into the muscle once.     Multiple Vitamins-Minerals (MULTIVITAMIN & MINERAL PO) Take by mouth.     No current facility-administered medications on file prior to visit.    Objective:     Vitals:   08/10/19 1010  BP: 133/74  Pulse: 69              Physical examination General NAD, Conversant  HEENT Atraumatic; Op clear  with mmm.  Normo-cephalic.  Pupils reactive. Anicteric sclerae  Thyroid/Neck Smooth without nodularity or enlargement. Normal ROM.  Neck Supple.  Skin No rashes, lesions or ulceration. Normal palpated skin turgor. No nodularity.  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  Symmetric.  No axillary adenopathy.  Lungs: Clear to auscultation.No rales or wheezes. Normal Respiratory effort, no retractions.  Heart: NSR.  No murmurs or rubs appreciated. No periferal edema  Abdomen: Soft.  Non-tender.  No masses.  No HSM. No hernia  Extremities: Moves all appropriately.  Normal ROM for age. No lymphadenopathy.  Neuro: Oriented to PPT.  Normal mood. Normal affect.     Pelvic:   Vulva: Normal appearance.  No lesions.   Vagina: No lesions or abnormalities noted.  Moderate vaginal atrophy  Support: Normal pelvic support.  Urethra No masses tenderness or scarring.  Meatus Normal size without lesions or prolapse.  Cervix: Surgically absent   Anus: Normal exam.  No lesions.  Perineum: Normal exam.  No lesions.        Bimanual   Uterus: Surgically absent   Adnexae: No masses.  Non-tender to palpation.  Cul-de-sac: Negative for abnormality.     Assessment:    G2P2002 Patient Active Problem List   Diagnosis Date Noted   Endometriosis 07/04/2015   Status post vaginal hysterectomy 07/04/2015   S/P BSO (status post bilateral salpingo-oophorectomy) 07/04/2015   Family history of breast cancer in sister 07/04/2015   Family history of ovarian cancer 07/04/2015   Seasonal allergies 07/04/2015   Increased BMI 07/04/2015   Cystocele 07/04/2015   Rectocele 07/04/2015     1. Well woman exam with routine gynecological exam   2. Encounter for screening mammogram for breast cancer   3. Family history of breast cancer in first degree relative        Plan:            1.  Basic Screening Recommendations The basic screening recommendations for asymptomatic women were  discussed with the patient during her visit.  The age-appropriate recommendations were discussed with her and the rational for the tests reviewed.  When I am informed by the patient that another primary care physician has previously obtained the age-appropriate tests and they are up-to-date, only outstanding tests are ordered and referrals given as necessary.  Abnormal results of tests will be discussed with her when all of her results are completed.  Routine preventative health maintenance measures emphasized: Exercise/Diet/Weight control, Tobacco Warnings, Alcohol/Substance use risks and Stress Management Mammogram ordered 2.  Have again discussed and recommended genetic testing-literature given 3.  Continue to recommend calcium and vitamin D (last DEXA scan normal) 4.  Weight loss encouraged 5.  Antifungals recommended for cutaneous monilia. Orders Orders Placed This Encounter  Procedures   MM 3D SCREEN BREAST BILATERAL    No orders of the defined types were placed in this encounter.       F/U  No follow-ups on file.  Elonda Husky, M.D. 08/10/2019 10:55 AM

## 2019-08-12 ENCOUNTER — Telehealth: Payer: Self-pay | Admitting: Obstetrics and Gynecology

## 2019-08-12 NOTE — Telephone Encounter (Signed)
Patient called in saying Dr. Logan Bores wanted her to get some genetic testing but she wanted to check with her insurance company if they would cover it or not. Patient got in touch with her insurance and they told her she needed these things completed:  1. Pre authorization done 2. More info on what test he wants patient to have 3. Codes for test  Could you please advise?

## 2019-08-13 NOTE — Telephone Encounter (Signed)
Need to discuss with Dr. Logan Bores.

## 2019-08-17 NOTE — Telephone Encounter (Signed)
Please advise 

## 2019-08-26 NOTE — Telephone Encounter (Signed)
LM for patient to return call.

## 2019-09-24 NOTE — Telephone Encounter (Signed)
LM for patient to return call.

## 2020-01-31 ENCOUNTER — Other Ambulatory Visit: Payer: Self-pay | Admitting: Obstetrics and Gynecology

## 2020-01-31 DIAGNOSIS — Z1231 Encounter for screening mammogram for malignant neoplasm of breast: Secondary | ICD-10-CM

## 2020-04-19 ENCOUNTER — Ambulatory Visit
Admission: RE | Admit: 2020-04-19 | Discharge: 2020-04-19 | Disposition: A | Discharge status: Home / Self Care | Payer: 59 | Source: Ambulatory Visit | Attending: Obstetrics and Gynecology | Admitting: Obstetrics and Gynecology

## 2020-04-19 ENCOUNTER — Other Ambulatory Visit: Payer: Self-pay

## 2020-04-19 DIAGNOSIS — Z1231 Encounter for screening mammogram for malignant neoplasm of breast: Secondary | ICD-10-CM

## 2020-07-18 LAB — SURGICAL PATHOLOGY

## 2020-08-15 ENCOUNTER — Encounter: Payer: 59 | Admitting: Obstetrics and Gynecology

## 2020-08-17 ENCOUNTER — Ambulatory Visit (INDEPENDENT_AMBULATORY_CARE_PROVIDER_SITE_OTHER): Payer: 59 | Admitting: Obstetrics and Gynecology

## 2020-08-17 ENCOUNTER — Other Ambulatory Visit: Payer: Self-pay

## 2020-08-17 ENCOUNTER — Encounter: Payer: Self-pay | Admitting: Obstetrics and Gynecology

## 2020-08-17 VITALS — BP 130/84 | HR 67 | Ht 66.0 in | Wt 189.1 lb

## 2020-08-17 DIAGNOSIS — Z01419 Encounter for gynecological examination (general) (routine) without abnormal findings: Secondary | ICD-10-CM | POA: Diagnosis not present

## 2020-08-17 DIAGNOSIS — Z803 Family history of malignant neoplasm of breast: Secondary | ICD-10-CM | POA: Diagnosis not present

## 2020-08-17 NOTE — Progress Notes (Signed)
HPI:      [name] is a 60 y.o. [phone] who LMP was No LMP recorded. Patient has had a hysterectomy.  Subjective:   She presents today for her annual examination.  She has no complaints.  She has been working on weight loss and on her elevated triglycerides and is making some progress. She has a family history of breast cancer in a first-degree relative as well as a moderate family history of colon cancer and cancer of the common bile duct.  She has decided upon genetic testing and is trying to find out how to get this done and how much it will cost her prior to having it ordered. She states that her last mammogram was in March. Of significant note patient has had a hysterectomy and bilateral oophorectomy Continues to take calcium and vitamin D daily.    Hx: The following portions of the patient's history were reviewed and updated as appropriate:             She  has a past medical history of Asthma, Atypical chest pain, Bulging of cervical intervertebral disc, Cystocele, Endometriosis, FH: breast cancer, FH: osteoporosis, FH: ovarian cancer, Fibrocystic breast, High cholesterol, Hypertriglyceridemia, Increased BMI, Menopausal state, Migraines, Rectocele, Seasonal allergies, Stress incontinence, Vaginal atrophy, and Vitamin B 12 deficiency. She does not have any pertinent problems on file. She  has a past surgical history that includes Bilateral salpingoophorectomy (2009); Vaginal hysterectomy; Appendectomy; laparoscopy; and Oophorectomy. Her family history includes Breast cancer in her cousin and cousin; Breast cancer (age of onset: 51) in her sister; Breast cancer (age of onset: 71) in her maternal aunt; Colon cancer in her paternal uncle; Diabetes in her father and sister; Heart disease in her father; Osteoporosis in her mother; Ovarian cancer in her sister; Parkinson's disease in her mother. She  reports that she has never smoked. She has never used smokeless tobacco. She reports  current alcohol use. She reports that she does not use drugs. She has a current medication list which includes the following prescription(s): calcium carb-cholecalciferol, multiple vitamins-minerals, fish oil, and cyanocobalamin. She is allergic to levaquin [levofloxacin in d5w].       Review of Systems:  Review of Systems  Constitutional: Denied constitutional symptoms, night sweats, recent illness, fatigue, fever, insomnia and weight loss.  Eyes: Denied eye symptoms, eye pain, photophobia, vision change and visual disturbance.  Ears/Nose/Throat/Neck: Denied ear, nose, throat or neck symptoms, hearing loss, nasal discharge, sinus congestion and sore throat.  Cardiovascular: Denied cardiovascular symptoms, arrhythmia, chest pain/pressure, edema, exercise intolerance, orthopnea and palpitations.  Respiratory: Denied pulmonary symptoms, asthma, pleuritic pain, productive sputum, cough, dyspnea and wheezing.  Gastrointestinal: Denied, gastro-esophageal reflux, melena, nausea and vomiting.  Genitourinary: Denied genitourinary symptoms including symptomatic vaginal discharge, pelvic relaxation issues, and urinary complaints.  Musculoskeletal: Denied musculoskeletal symptoms, stiffness, swelling, muscle weakness and myalgia.  Dermatologic: Denied dermatology symptoms, rash and scar.  Neurologic: Denied neurology symptoms, dizziness, headache, neck pain and syncope.  Psychiatric: Denied psychiatric symptoms, anxiety and depression.  Endocrine: Denied endocrine symptoms including hot flashes and night sweats.   Meds:   Current Outpatient Medications on File Prior to Visit  Medication Sig Dispense Refill   Calcium Carb-Cholecalciferol (CALCIUM 1000 + D PO) Take by mouth.     Multiple Vitamins-Minerals (MULTIVITAMIN & MINERAL PO) Take by mouth.     Omega-3 Fatty Acids (FISH OIL) 1000 MG CAPS Take by mouth.     cyanocobalamin (,VITAMIN B-12,) 1000 MCG/ML injection Inject 1,000 mcg into  the muscle  once.     No current facility-administered medications on file prior to visit.          Objective:     Vitals:   08/17/20 0849  BP: 130/84  Pulse: 67    Filed Weights   08/17/20 0849  Weight: 189 lb 1.6 oz (85.8 kg)              Physical examination General NAD, Conversant  HEENT Atraumatic; Op clear with mmm.  Normo-cephalic. Pupils reactive. Anicteric sclerae  Thyroid/Neck Smooth without nodularity or enlargement. Normal ROM.  Neck Supple.  Skin No rashes, lesions or ulceration. Normal palpated skin turgor. No nodularity.  Breasts: No masses or discharge.  Symmetric.  No axillary adenopathy.  Lungs: Clear to auscultation.No rales or wheezes. Normal Respiratory effort, no retractions.  Heart: NSR.  No murmurs or rubs appreciated. No periferal edema  Abdomen: Soft.  Non-tender.  No masses.  No HSM. No hernia  Extremities: Moves all appropriately.  Normal ROM for age. No lymphadenopathy.  Neuro: Oriented to PPT.  Normal mood. Normal affect.     Pelvic:   Vulva: Normal appearance.  No lesions.   Vagina: No lesions or abnormalities noted.  Support: Second-degree rectocele/cystocele.  Urethra No masses tenderness or scarring.  Meatus Normal size without lesions or prolapse.  Cervix: Surgically absent   Anus: Normal exam.  No lesions.  Perineum: Normal exam.  No lesions.        Bimanual   Uterus: Surgically absent   Adnexae: No masses.  Non-tender to palpation.  Cul-de-sac: Negative for abnormality.     Assessment:    G2P2002 Patient Active Problem List   Diagnosis Date Noted   Endometriosis 07/04/2015   Status post vaginal hysterectomy 07/04/2015   S/P BSO (status post bilateral salpingo-oophorectomy) 07/04/2015   Family history of breast cancer in sister 07/04/2015   Family history of ovarian cancer 07/04/2015   Seasonal allergies 07/04/2015   Increased BMI 07/04/2015   Cystocele 07/04/2015   Rectocele 07/04/2015     1. Well woman exam with routine  gynecological exam   2. Family history of breast cancer in first degree relative     Patient plans to have genetic testing performed   Plan:            1.  Basic Screening Recommendations The basic screening recommendations for asymptomatic women were discussed with the patient during her visit.  The age-appropriate recommendations were discussed with her and the rational for the tests reviewed.  When I am informed by the patient that another primary care physician has previously obtained the age-appropriate tests and they are up-to-date, only outstanding tests are ordered and referrals given as necessary.  Abnormal results of tests will be discussed with her when all of her results are completed.  Routine preventative health maintenance measures emphasized: Exercise/Diet/Weight control, Tobacco Warnings, Alcohol/Substance use risks and Stress Management 2.  We will contact [facility] for correct testing.  Genetic testing ordered Orders No orders of the defined types were placed in this encounter.   No orders of the defined types were placed in this encounter.        F/U  Return in about 1 year (around 08/17/2021) for Annual Physical.  Elonda Husky, M.D. 08/17/2020 9:37 AM

## 2021-03-02 DIAGNOSIS — D2271 Melanocytic nevi of right lower limb, including hip: Secondary | ICD-10-CM | POA: Diagnosis not present

## 2021-03-02 DIAGNOSIS — D2261 Melanocytic nevi of right upper limb, including shoulder: Secondary | ICD-10-CM | POA: Diagnosis not present

## 2021-03-02 DIAGNOSIS — D2262 Melanocytic nevi of left upper limb, including shoulder: Secondary | ICD-10-CM | POA: Diagnosis not present

## 2021-03-02 DIAGNOSIS — D2272 Melanocytic nevi of left lower limb, including hip: Secondary | ICD-10-CM | POA: Diagnosis not present

## 2021-03-02 DIAGNOSIS — D225 Melanocytic nevi of trunk: Secondary | ICD-10-CM | POA: Diagnosis not present

## 2021-03-02 DIAGNOSIS — L821 Other seborrheic keratosis: Secondary | ICD-10-CM | POA: Diagnosis not present

## 2021-03-13 ENCOUNTER — Other Ambulatory Visit: Payer: Self-pay | Admitting: Obstetrics and Gynecology

## 2021-03-13 DIAGNOSIS — Z1231 Encounter for screening mammogram for malignant neoplasm of breast: Secondary | ICD-10-CM

## 2021-03-30 DIAGNOSIS — M545 Low back pain, unspecified: Secondary | ICD-10-CM | POA: Diagnosis not present

## 2021-04-02 DIAGNOSIS — M7502 Adhesive capsulitis of left shoulder: Secondary | ICD-10-CM | POA: Diagnosis not present

## 2021-04-10 ENCOUNTER — Other Ambulatory Visit: Payer: Self-pay | Admitting: Specialist

## 2021-04-10 DIAGNOSIS — R519 Headache, unspecified: Secondary | ICD-10-CM

## 2021-04-13 DIAGNOSIS — M545 Low back pain, unspecified: Secondary | ICD-10-CM | POA: Diagnosis not present

## 2021-04-20 ENCOUNTER — Ambulatory Visit
Admission: RE | Admit: 2021-04-20 | Discharge: 2021-04-20 | Disposition: A | Discharge status: Home / Self Care | Payer: 59 | Source: Ambulatory Visit | Attending: Obstetrics and Gynecology | Admitting: Obstetrics and Gynecology

## 2021-04-20 ENCOUNTER — Other Ambulatory Visit: Payer: Self-pay

## 2021-04-20 DIAGNOSIS — Z1231 Encounter for screening mammogram for malignant neoplasm of breast: Secondary | ICD-10-CM | POA: Diagnosis not present

## 2021-04-30 ENCOUNTER — Other Ambulatory Visit: Payer: Self-pay | Admitting: Orthopaedic Surgery

## 2021-04-30 DIAGNOSIS — M545 Low back pain, unspecified: Secondary | ICD-10-CM

## 2021-05-07 DIAGNOSIS — M7502 Adhesive capsulitis of left shoulder: Secondary | ICD-10-CM | POA: Diagnosis not present

## 2021-05-09 ENCOUNTER — Ambulatory Visit
Admission: RE | Admit: 2021-05-09 | Discharge: 2021-05-09 | Disposition: A | Discharge status: Home / Self Care | Payer: 59 | Source: Ambulatory Visit | Attending: Orthopaedic Surgery | Admitting: Orthopaedic Surgery

## 2021-05-09 DIAGNOSIS — R202 Paresthesia of skin: Secondary | ICD-10-CM | POA: Diagnosis not present

## 2021-05-09 DIAGNOSIS — M545 Low back pain, unspecified: Secondary | ICD-10-CM | POA: Diagnosis not present

## 2021-05-09 DIAGNOSIS — R200 Anesthesia of skin: Secondary | ICD-10-CM | POA: Diagnosis not present

## 2021-05-21 DIAGNOSIS — M5416 Radiculopathy, lumbar region: Secondary | ICD-10-CM | POA: Diagnosis not present

## 2021-06-05 DIAGNOSIS — E785 Hyperlipidemia, unspecified: Secondary | ICD-10-CM | POA: Diagnosis not present

## 2021-06-05 DIAGNOSIS — E538 Deficiency of other specified B group vitamins: Secondary | ICD-10-CM | POA: Diagnosis not present

## 2021-06-05 DIAGNOSIS — Z0000 Encounter for general adult medical examination without abnormal findings: Secondary | ICD-10-CM | POA: Diagnosis not present

## 2021-06-06 DIAGNOSIS — M5416 Radiculopathy, lumbar region: Secondary | ICD-10-CM | POA: Diagnosis not present

## 2021-06-11 DIAGNOSIS — E785 Hyperlipidemia, unspecified: Secondary | ICD-10-CM | POA: Diagnosis not present

## 2021-06-11 DIAGNOSIS — E538 Deficiency of other specified B group vitamins: Secondary | ICD-10-CM | POA: Diagnosis not present

## 2021-06-11 DIAGNOSIS — M5090 Cervical disc disorder, unspecified, unspecified cervical region: Secondary | ICD-10-CM | POA: Diagnosis not present

## 2021-06-11 DIAGNOSIS — R1013 Epigastric pain: Secondary | ICD-10-CM | POA: Diagnosis not present

## 2021-06-15 DIAGNOSIS — M545 Low back pain, unspecified: Secondary | ICD-10-CM | POA: Diagnosis not present

## 2021-07-03 DIAGNOSIS — M5416 Radiculopathy, lumbar region: Secondary | ICD-10-CM | POA: Diagnosis not present

## 2021-07-05 DIAGNOSIS — E785 Hyperlipidemia, unspecified: Secondary | ICD-10-CM | POA: Diagnosis not present

## 2021-07-10 DIAGNOSIS — M25571 Pain in right ankle and joints of right foot: Secondary | ICD-10-CM | POA: Diagnosis not present

## 2021-07-10 DIAGNOSIS — M76821 Posterior tibial tendinitis, right leg: Secondary | ICD-10-CM | POA: Diagnosis not present

## 2021-07-30 DIAGNOSIS — M25571 Pain in right ankle and joints of right foot: Secondary | ICD-10-CM | POA: Diagnosis not present

## 2021-07-30 DIAGNOSIS — M76821 Posterior tibial tendinitis, right leg: Secondary | ICD-10-CM | POA: Diagnosis not present

## 2021-08-01 DIAGNOSIS — H43811 Vitreous degeneration, right eye: Secondary | ICD-10-CM | POA: Diagnosis not present

## 2021-08-22 ENCOUNTER — Encounter: Payer: Self-pay | Admitting: Obstetrics and Gynecology

## 2021-08-22 ENCOUNTER — Ambulatory Visit (INDEPENDENT_AMBULATORY_CARE_PROVIDER_SITE_OTHER): Payer: 59 | Admitting: Obstetrics and Gynecology

## 2021-08-22 VITALS — BP 142/80 | HR 73 | Ht 66.0 in | Wt 198.7 lb

## 2021-08-22 DIAGNOSIS — Z803 Family history of malignant neoplasm of breast: Secondary | ICD-10-CM | POA: Diagnosis not present

## 2021-08-22 DIAGNOSIS — Z01419 Encounter for gynecological examination (general) (routine) without abnormal findings: Secondary | ICD-10-CM | POA: Diagnosis not present

## 2021-08-22 NOTE — Progress Notes (Signed)
Patients presents for annual exam today. She states doing well, recently back from vacation and about to go on another. Patient is up to date on mammogram. Patients annual labs are declined at this time. Patient states no other questions or concerns at this time.

## 2021-08-22 NOTE — Progress Notes (Signed)
HPI:      [name] is a 61 y.o. [id number] who LMP was No LMP recorded. Patient has had a hysterectomy.  Subjective:   She presents today for her annual examination.  She reports that she has no GYN issues at this time.  She did undergo genetic testing which was negative (family history of breast and ovarian)  She does report that she is having some trouble with her left shoulder her back and her right ankle.  She is doing PT and seeing orthopedics for this.  Ongoing work-up.  Of significant note she has had a previous hysterectomy.    Hx: The following portions of the patient's history were reviewed and updated as appropriate:             She  has a past medical history of Asthma, Atypical chest pain, Bulging of cervical intervertebral disc, Cystocele, Endometriosis, FH: breast cancer, FH: osteoporosis, FH: ovarian cancer, Fibrocystic breast, High cholesterol, Hypertriglyceridemia, Increased BMI, Menopausal state, Migraines, Rectocele, Seasonal allergies, Stress incontinence, Vaginal atrophy, and Vitamin B 12 deficiency. She does not have any pertinent problems on file. She  has a past surgical history that includes Bilateral salpingoophorectomy (2009); Vaginal hysterectomy; Appendectomy; laparoscopy; and Oophorectomy. Her family history includes Breast cancer in her cousin and cousin; Breast cancer (age of onset: 41) in her sister; Breast cancer (age of onset: 73) in her maternal aunt; Colon cancer in her paternal uncle; Diabetes in her father and sister; Heart disease in her father; Osteoporosis in her mother; Ovarian cancer in her sister; Parkinson's disease in her mother. She  reports that she has never smoked. She has never used smokeless tobacco. She reports current alcohol use. She reports that she does not use drugs. She has a current medication list which includes the following prescription(s): calcium carb-cholecalciferol, multiple vitamins-minerals, fish oil, and vitamin  b-12. She is allergic to levaquin [levofloxacin in d5w].       Review of Systems:  Review of Systems  Constitutional: Denied constitutional symptoms, night sweats, recent illness, fatigue, fever, insomnia and weight loss.  Eyes: Denied eye symptoms, eye pain, photophobia, vision change and visual disturbance.  Ears/Nose/Throat/Neck: Denied ear, nose, throat or neck symptoms, hearing loss, nasal discharge, sinus congestion and sore throat.  Cardiovascular: Denied cardiovascular symptoms, arrhythmia, chest pain/pressure, edema, exercise intolerance, orthopnea and palpitations.  Respiratory: Denied pulmonary symptoms, asthma, pleuritic pain, productive sputum, cough, dyspnea and wheezing.  Gastrointestinal: Denied, gastro-esophageal reflux, melena, nausea and vomiting.  Genitourinary: Denied genitourinary symptoms including symptomatic vaginal discharge, pelvic relaxation issues, and urinary complaints.  Musculoskeletal: Denied musculoskeletal symptoms, stiffness, swelling, muscle weakness and myalgia.  Dermatologic: Denied dermatology symptoms, rash and scar.  Neurologic: Denied neurology symptoms, dizziness, headache, neck pain and syncope.  Psychiatric: Denied psychiatric symptoms, anxiety and depression.  Endocrine: Denied endocrine symptoms including hot flashes and night sweats.   Meds:   Current Outpatient Medications on File Prior to Visit  Medication Sig Dispense Refill   Calcium Carb-Cholecalciferol (CALCIUM 1000 + D PO) Take by mouth.     Multiple Vitamins-Minerals (MULTIVITAMIN & MINERAL PO) Take by mouth.     Omega-3 Fatty Acids (FISH OIL) 1000 MG CAPS Take by mouth.     vitamin B-12 (CYANOCOBALAMIN) 250 MCG tablet Take 250 mcg by mouth daily.     No current facility-administered medications on file prior to visit.     Objective:     Vitals:   08/22/21 0754  BP: (!) 142/80  Pulse: 73  Filed Weights   08/22/21 0754  Weight: 198 lb 11.2 oz (90.1 kg)               Physical examination General NAD, Conversant  HEENT Atraumatic; Op clear with mmm.  Normo-cephalic. Pupils reactive. Anicteric sclerae  Thyroid/Neck Smooth without nodularity or enlargement. Normal ROM.  Neck Supple.  Skin No rashes, lesions or ulceration. Normal palpated skin turgor. No nodularity.  Breasts: No masses or discharge.  Symmetric.  No axillary adenopathy.  Lungs: Clear to auscultation.No rales or wheezes. Normal Respiratory effort, no retractions.  Heart: NSR.  No murmurs or rubs appreciated. No periferal edema  Abdomen: Soft.  Non-tender.  No masses.  No HSM. No hernia  Extremities: Moves all appropriately.  Normal ROM for age. No lymphadenopathy.  Neuro: Oriented to PPT.  Normal mood. Normal affect.     Pelvic:   Vulva: Normal appearance.  No lesions.  Vagina: No lesions or abnormalities noted.  Support: Normal pelvic support.  Urethra No masses tenderness or scarring.  Meatus Normal size without lesions or prolapse.  Cervix: Surgically absent  Anus: Normal exam.  No lesions.  Perineum: Normal exam.  No lesions.        Bimanual   Uterus: Surgically absent  Adnexae: No masses.  Non-tender to palpation.  Cul-de-sac: Negative for abnormality.     Assessment:    G2P2002 Patient Active Problem List   Diagnosis Date Noted   Endometriosis 07/04/2015   Status post vaginal hysterectomy 07/04/2015   S/P BSO (status post bilateral salpingo-oophorectomy) 07/04/2015   Family history of breast cancer in sister 07/04/2015   Family history of ovarian cancer 07/04/2015   Seasonal allergies 07/04/2015   Increased BMI 07/04/2015   Cystocele 07/04/2015   Rectocele 07/04/2015     1. Well woman exam with routine gynecological exam   2. Family history of breast cancer in first degree relative        Plan:            1.  Basic Screening Recommendations The basic screening recommendations for asymptomatic women were discussed with the patient during her visit.  The  age-appropriate recommendations were discussed with her and the rational for the tests reviewed.  When I am informed by the patient that another primary care physician has previously obtained the age-appropriate tests and they are up-to-date, only outstanding tests are ordered and referrals given as necessary.  Abnormal results of tests will be discussed with her when all of her results are completed.  Routine preventative health maintenance measures emphasized: Exercise/Diet/Weight control, Tobacco Warnings, Alcohol/Substance use risks and Stress Management Mammogram ordered Orders No orders of the defined types were placed in this encounter.   No orders of the defined types were placed in this encounter.          F/U  No follow-ups on file.  Elonda Husky, M.D. 08/22/2021 8:54 AM

## 2021-09-11 ENCOUNTER — Other Ambulatory Visit: Payer: Self-pay | Admitting: Orthopaedic Surgery

## 2021-09-11 DIAGNOSIS — M25571 Pain in right ankle and joints of right foot: Secondary | ICD-10-CM

## 2021-09-19 ENCOUNTER — Ambulatory Visit
Admission: RE | Admit: 2021-09-19 | Discharge: 2021-09-19 | Disposition: A | Discharge status: Home / Self Care | Payer: 59 | Source: Ambulatory Visit | Attending: Orthopaedic Surgery | Admitting: Orthopaedic Surgery

## 2021-09-19 DIAGNOSIS — M25571 Pain in right ankle and joints of right foot: Secondary | ICD-10-CM

## 2021-09-24 ENCOUNTER — Other Ambulatory Visit: Payer: Self-pay | Admitting: Orthopaedic Surgery

## 2021-09-26 ENCOUNTER — Other Ambulatory Visit: Payer: Self-pay | Admitting: Orthopaedic Surgery

## 2021-10-17 ENCOUNTER — Other Ambulatory Visit: Payer: Self-pay

## 2021-10-17 ENCOUNTER — Encounter (HOSPITAL_COMMUNITY): Payer: Self-pay | Admitting: Orthopaedic Surgery

## 2021-10-17 NOTE — Pre-Procedure Instructions (Signed)
PCP - Dr. Daniel Nones Cardiologist - denies  PPM/ICD - denies   Chest x-ray - 01/28/20 EKG - 02/28/20 Stress Test - 02/28/20 ECHO - 02/28/20 Cardiac Cath - denies  CPAP - denies  DM- denies  ASA/Blood Thinner Instructions: n/a   ERAS Protcol - clears until 1130  COVID TEST- n/a  Anesthesia review: no  Patient verbally denies any shortness of breath, fever, cough and chest pain during phone call   -------------  SDW INSTRUCTIONS given:  Your procedure is scheduled on 10/18/21.  Report to [facility] Main Entrance "A" at 12:00 P.M., and check in at the Admitting office.  Call this number if you have problems the morning of surgery:  [phone]   Remember:  Do not eat after midnight the night before your surgery  You may drink clear liquids until 1130 AM the morning of your surgery.   Clear liquids allowed are: Water, Non-Citrus Juices (without pulp), Carbonated Beverages, Clear Tea, Black Coffee Only, and Gatorade    Take these medicines the morning of surgery with A SIP OF WATER  acetaminophen (TYLENOL) if needed fluticasone (KLS ALLER-FLO)   As of today, STOP taking any Aspirin (unless otherwise instructed by your surgeon) Aleve, Naproxen, Ibuprofen, Motrin, Advil, Goody's, BC's, all herbal medications, fish oil, and all vitamins. This includes meloxicam (MOBIC)                       Do not wear jewelry, make up, or nail polish            Do not wear lotions, powders, perfumes, or deodorant.            Do not shave 48 hours prior to surgery.              Do not bring valuables to the hospital.            [facility] is not responsible for any belongings or valuables.  Do NOT Smoke (Tobacco/Vaping) 24 hours prior to your procedure If you use a CPAP at night, you may bring all equipment for your overnight stay.   Contacts, glasses, dentures or bridgework may not be worn into surgery.      For patients admitted to the hospital, discharge time will be determined by your  treatment team.   Patients discharged the day of surgery will not be allowed to drive home, and someone needs to stay with them for 24 hours.    Special instructions:   [place]- Preparing For Surgery  Before surgery, you can play an important role. Because skin is not sterile, your skin needs to be as free of germs as possible. You can reduce the number of germs on your skin by washing with CHG (chlorahexidine gluconate) Soap before surgery.  CHG is an antiseptic cleaner which kills germs and bonds with the skin to continue killing germs even after washing.    Oral Hygiene is also important to reduce your risk of infection.  Remember - BRUSH YOUR TEETH THE MORNING OF SURGERY WITH YOUR REGULAR TOOTHPASTE  Please do not use if you have an allergy to CHG or antibacterial soaps. If your skin becomes reddened/irritated stop using the CHG.  Do not shave (including legs and underarms) for at least 48 hours prior to first CHG shower. It is OK to shave your face.  Please follow these instructions carefully.   Shower the NIGHT BEFORE SURGERY and the MORNING OF SURGERY with DIAL Soap.  Pat yourself dry with a CLEAN TOWEL.  Wear CLEAN PAJAMAS to bed the night before surgery  Place CLEAN SHEETS on your bed the night of your first shower and DO NOT SLEEP WITH PETS.   Day of Surgery: Please shower morning of surgery  Wear Clean/Comfortable clothing the morning of surgery Do not apply any deodorants/lotions.   Remember to brush your teeth WITH YOUR REGULAR TOOTHPASTE.   Questions were answered. Patient verbalized understanding of instructions.

## 2021-10-18 ENCOUNTER — Ambulatory Visit (HOSPITAL_COMMUNITY)
Admission: RE | Admit: 2021-10-18 | Discharge: 2021-10-19 | Disposition: A | Discharge status: Home / Self Care | Payer: 59 | Attending: Orthopaedic Surgery | Admitting: Orthopaedic Surgery

## 2021-10-18 ENCOUNTER — Encounter (HOSPITAL_COMMUNITY)
Admission: RE | Disposition: A | Discharge status: Home / Self Care | Payer: Self-pay | Source: Home / Self Care | Attending: Orthopaedic Surgery

## 2021-10-18 ENCOUNTER — Ambulatory Visit (HOSPITAL_BASED_OUTPATIENT_CLINIC_OR_DEPARTMENT_OTHER): Payer: 59 | Admitting: Anesthesiology

## 2021-10-18 ENCOUNTER — Encounter (HOSPITAL_COMMUNITY): Payer: Self-pay | Admitting: Orthopaedic Surgery

## 2021-10-18 ENCOUNTER — Other Ambulatory Visit: Payer: Self-pay

## 2021-10-18 ENCOUNTER — Ambulatory Visit (HOSPITAL_COMMUNITY): Payer: 59 | Admitting: Anesthesiology

## 2021-10-18 DIAGNOSIS — X58XXXA Exposure to other specified factors, initial encounter: Secondary | ICD-10-CM | POA: Insufficient documentation

## 2021-10-18 DIAGNOSIS — J45909 Unspecified asthma, uncomplicated: Secondary | ICD-10-CM | POA: Diagnosis not present

## 2021-10-18 DIAGNOSIS — M2141 Flat foot [pes planus] (acquired), right foot: Secondary | ICD-10-CM

## 2021-10-18 DIAGNOSIS — S86111A Strain of other muscle(s) and tendon(s) of posterior muscle group at lower leg level, right leg, initial encounter: Secondary | ICD-10-CM | POA: Insufficient documentation

## 2021-10-18 DIAGNOSIS — M79671 Pain in right foot: Secondary | ICD-10-CM | POA: Diagnosis present

## 2021-10-18 HISTORY — DX: Anemia, unspecified: D64.9

## 2021-10-18 HISTORY — DX: Nausea with vomiting, unspecified: R11.2

## 2021-10-18 HISTORY — PX: RECONSTRUCTION OF CORACOCLAVICULAR LIGAMENT: SHX6045

## 2021-10-18 HISTORY — PX: CALCANEAL OSTEOTOMY: SHX1281

## 2021-10-18 HISTORY — DX: Other specified postprocedural states: Z98.890

## 2021-10-18 LAB — BASIC METABOLIC PANEL
Anion gap: 8 (ref 5–15)
BUN: 18 mg/dL (ref 6–20)
CO2: 24 mmol/L (ref 22–32)
Calcium: 9.2 mg/dL (ref 8.9–10.3)
Chloride: 108 mmol/L (ref 98–111)
Creatinine, Ser: 0.85 mg/dL (ref 0.44–1.00)
GFR, Estimated: >60 mL/min (ref >60)
Glucose, Bld: 96 mg/dL (ref 70–99)
Potassium: 4.2 mmol/L (ref 3.5–5.1)
Sodium: 140 mmol/L (ref 135–145)

## 2021-10-18 LAB — SURGICAL PCR SCREEN
MRSA, PCR: NEGATIVE
Staphylococcus aureus: POSITIVE — AB

## 2021-10-18 LAB — CBC
HCT: 40.9 % (ref 36.0–46.0)
Hemoglobin: 13.9 g/dL (ref 12.0–15.0)
MCH: 29.9 pg (ref 26.0–34.0)
MCHC: 34 g/dL (ref 30.0–36.0)
MCV: 88 fL (ref 80.0–100.0)
Platelets: 214 10*3/uL (ref 150–400)
RBC: 4.65 MIL/uL (ref 3.87–5.11)
RDW: 13.2 % (ref 11.5–15.5)
WBC: 4.6 10*3/uL (ref 4.0–10.5)
nRBC: 0 % (ref 0.0–0.2)

## 2021-10-18 SURGERY — OSTEOTOMY, CALCANEUS
Anesthesia: Regional | Laterality: Right

## 2021-10-18 MED ORDER — PHENYLEPHRINE HCL (PRESSORS) 10 MG/ML IV SOLN
INTRAVENOUS | Status: AC
  Filled 2021-10-18: qty 1

## 2021-10-18 MED ORDER — OXYCODONE HCL 5 MG PO TABS: 5.0000 mg | ORAL_TABLET | ORAL | 0 refills | Status: AC | PRN

## 2021-10-18 MED ORDER — FENTANYL CITRATE (PF) 100 MCG/2ML IJ SOLN
INTRAMUSCULAR | Status: AC
  Filled 2021-10-18: qty 2

## 2021-10-18 MED ORDER — FENTANYL CITRATE (PF) 100 MCG/2ML IJ SOLN: 50.0000 ug | Freq: Once | INTRAMUSCULAR | Status: AC

## 2021-10-18 MED ORDER — FENTANYL CITRATE (PF) 250 MCG/5ML IJ SOLN
INTRAMUSCULAR | Status: AC
  Filled 2021-10-18: qty 5

## 2021-10-18 MED ORDER — ONDANSETRON HCL 4 MG/2ML IJ SOLN
4.0000 mg | Freq: Four times a day (QID) | INTRAMUSCULAR | Status: DC | PRN
  Filled 2021-10-18: qty 2

## 2021-10-18 MED ORDER — ONDANSETRON HCL 4 MG/2ML IJ SOLN
INTRAMUSCULAR | Status: DC | PRN
  Administered 2021-10-18: 4 mg via INTRAVENOUS

## 2021-10-18 MED ORDER — METOCLOPRAMIDE HCL 5 MG PO TABS: 5.0000 mg | ORAL_TABLET | Freq: Three times a day (TID) | ORAL | Status: DC | PRN

## 2021-10-18 MED ORDER — NAPROXEN 250 MG PO TABS
250.0000 mg | ORAL_TABLET | Freq: Two times a day (BID) | ORAL | Status: DC
  Administered 2021-10-19: 250 mg via ORAL
  Filled 2021-10-18: qty 1

## 2021-10-18 MED ORDER — CEFAZOLIN SODIUM-DEXTROSE 2-4 GM/100ML-% IV SOLN
2.0000 g | INTRAVENOUS | Status: AC
  Administered 2021-10-18: 2 g via INTRAVENOUS

## 2021-10-18 MED ORDER — METOCLOPRAMIDE HCL 5 MG/ML IJ SOLN: 5.0000 mg | Freq: Three times a day (TID) | INTRAMUSCULAR | Status: DC | PRN

## 2021-10-18 MED ORDER — MIDAZOLAM HCL 2 MG/2ML IJ SOLN
INTRAMUSCULAR | Status: AC
  Administered 2021-10-18: 2 mg via INTRAVENOUS
  Filled 2021-10-18: qty 2

## 2021-10-18 MED ORDER — ASPIRIN 325 MG PO TABS: 325.0000 mg | ORAL_TABLET | Freq: Every day | ORAL | 0 refills | Status: AC

## 2021-10-18 MED ORDER — OXYCODONE HCL 5 MG PO TABS
5.0000 mg | ORAL_TABLET | Freq: Once | ORAL | Status: AC | PRN
  Administered 2021-10-18: 5 mg via ORAL

## 2021-10-18 MED ORDER — 0.9 % SODIUM CHLORIDE (POUR BTL) OPTIME
TOPICAL | Status: DC | PRN
  Administered 2021-10-18: 1000 mL

## 2021-10-18 MED ORDER — CHLORHEXIDINE GLUCONATE 0.12 % MT SOLN
OROMUCOSAL | Status: AC
  Administered 2021-10-18: 15 mL via OROMUCOSAL
  Filled 2021-10-18: qty 15

## 2021-10-18 MED ORDER — FENTANYL CITRATE (PF) 100 MCG/2ML IJ SOLN
INTRAMUSCULAR | Status: AC
  Administered 2021-10-18: 50 ug
  Filled 2021-10-18: qty 2

## 2021-10-18 MED ORDER — AMISULPRIDE (ANTIEMETIC) 5 MG/2ML IV SOLN: 10.0000 mg | Freq: Once | INTRAVENOUS | Status: AC | PRN

## 2021-10-18 MED ORDER — ACETAMINOPHEN 500 MG PO TABS
500.0000 mg | ORAL_TABLET | Freq: Four times a day (QID) | ORAL | Status: DC
  Administered 2021-10-18 – 2021-10-19 (×2): 500 mg via ORAL
  Filled 2021-10-18: qty 1

## 2021-10-18 MED ORDER — FENTANYL CITRATE (PF) 100 MCG/2ML IJ SOLN
25.0000 ug | INTRAMUSCULAR | Status: DC | PRN
  Administered 2021-10-18 (×3): 50 ug via INTRAVENOUS

## 2021-10-18 MED ORDER — PHENYLEPHRINE 80 MCG/ML (10ML) SYRINGE FOR IV PUSH (FOR BLOOD PRESSURE SUPPORT)
PREFILLED_SYRINGE | INTRAVENOUS | Status: AC
  Filled 2021-10-18: qty 10

## 2021-10-18 MED ORDER — PROPOFOL 10 MG/ML IV BOLUS
INTRAVENOUS | Status: AC
  Filled 2021-10-18: qty 20

## 2021-10-18 MED ORDER — DEXAMETHASONE SODIUM PHOSPHATE 10 MG/ML IJ SOLN
INTRAMUSCULAR | Status: DC | PRN
  Administered 2021-10-18: 10 mg via INTRAVENOUS

## 2021-10-18 MED ORDER — ONDANSETRON HCL 4 MG/2ML IJ SOLN
4.0000 mg | Freq: Once | INTRAMUSCULAR | Status: AC | PRN
  Administered 2021-10-18: 4 mg via INTRAVENOUS

## 2021-10-18 MED ORDER — MIDAZOLAM HCL 2 MG/2ML IJ SOLN
INTRAMUSCULAR | Status: AC
  Filled 2021-10-18: qty 2

## 2021-10-18 MED ORDER — ACETAMINOPHEN 500 MG PO TABS
ORAL_TABLET | ORAL | Status: AC
  Administered 2021-10-18: 1000 mg via ORAL
  Filled 2021-10-18: qty 2

## 2021-10-18 MED ORDER — HYDROMORPHONE HCL 1 MG/ML IJ SOLN: 0.2500 mg | INTRAMUSCULAR | Status: DC | PRN

## 2021-10-18 MED ORDER — ONDANSETRON HCL 4 MG/2ML IJ SOLN
INTRAMUSCULAR | Status: AC
  Filled 2021-10-18: qty 2

## 2021-10-18 MED ORDER — HYDROMORPHONE HCL 1 MG/ML IJ SOLN
INTRAMUSCULAR | Status: AC
  Filled 2021-10-18: qty 1

## 2021-10-18 MED ORDER — HYDROCODONE-ACETAMINOPHEN 5-325 MG PO TABS
1.0000 | ORAL_TABLET | ORAL | Status: DC | PRN
  Administered 2021-10-19: 1 via ORAL
  Filled 2021-10-18: qty 1

## 2021-10-18 MED ORDER — ONDANSETRON HCL 4 MG/2ML IJ SOLN: 4.0000 mg | Freq: Four times a day (QID) | INTRAMUSCULAR | Status: DC | PRN

## 2021-10-18 MED ORDER — MORPHINE SULFATE (PF) 2 MG/ML IV SOLN: 0.5000 mg | INTRAVENOUS | Status: DC | PRN

## 2021-10-18 MED ORDER — PROPOFOL 10 MG/ML IV BOLUS
INTRAVENOUS | Status: DC | PRN
  Administered 2021-10-18: 50 mg via INTRAVENOUS
  Administered 2021-10-18: 150 mg via INTRAVENOUS
  Administered 2021-10-18: 20 mg via INTRAVENOUS

## 2021-10-18 MED ORDER — OXYCODONE HCL 5 MG PO TABS: 5.0000 mg | ORAL_TABLET | Freq: Once | ORAL | Status: DC | PRN

## 2021-10-18 MED ORDER — HYDROMORPHONE HCL 1 MG/ML IJ SOLN
0.2500 mg | INTRAMUSCULAR | Status: DC | PRN
  Administered 2021-10-18 (×2): 0.5 mg via INTRAVENOUS

## 2021-10-18 MED ORDER — BUPIVACAINE-EPINEPHRINE (PF) 0.5% -1:200000 IJ SOLN
INTRAMUSCULAR | Status: AC
  Filled 2021-10-18: qty 30

## 2021-10-18 MED ORDER — ROPIVACAINE HCL 5 MG/ML IJ SOLN
INTRAMUSCULAR | Status: DC | PRN
  Administered 2021-10-18: 30 mL via PERINEURAL

## 2021-10-18 MED ORDER — LACTATED RINGERS IV SOLN: INTRAVENOUS | Status: DC

## 2021-10-18 MED ORDER — ONDANSETRON HCL 4 MG PO TABS: 4.0000 mg | ORAL_TABLET | Freq: Four times a day (QID) | ORAL | Status: DC | PRN

## 2021-10-18 MED ORDER — OXYCODONE HCL 5 MG/5ML PO SOLN: 5.0000 mg | Freq: Once | ORAL | Status: AC | PRN

## 2021-10-18 MED ORDER — METHOCARBAMOL 1000 MG/10ML IJ SOLN: 500.0000 mg | Freq: Four times a day (QID) | INTRAVENOUS | Status: DC | PRN

## 2021-10-18 MED ORDER — CEFAZOLIN SODIUM-DEXTROSE 2-4 GM/100ML-% IV SOLN
INTRAVENOUS | Status: AC
  Filled 2021-10-18: qty 100

## 2021-10-18 MED ORDER — MIDAZOLAM HCL 2 MG/2ML IJ SOLN: 2.0000 mg | Freq: Once | INTRAMUSCULAR | Status: AC

## 2021-10-18 MED ORDER — PHENYLEPHRINE 80 MCG/ML (10ML) SYRINGE FOR IV PUSH (FOR BLOOD PRESSURE SUPPORT)
PREFILLED_SYRINGE | INTRAVENOUS | Status: DC | PRN
  Administered 2021-10-18: 80 ug via INTRAVENOUS
  Administered 2021-10-18 (×3): 160 ug via INTRAVENOUS
  Administered 2021-10-18: 80 ug via INTRAVENOUS

## 2021-10-18 MED ORDER — METHOCARBAMOL 500 MG PO TABS: 500.0000 mg | ORAL_TABLET | Freq: Four times a day (QID) | ORAL | Status: DC | PRN

## 2021-10-18 MED ORDER — SUGAMMADEX SODIUM 200 MG/2ML IV SOLN
INTRAVENOUS | Status: DC | PRN
  Administered 2021-10-18: 200 mg via INTRAVENOUS

## 2021-10-18 MED ORDER — DIPHENHYDRAMINE HCL 12.5 MG/5ML PO ELIX: 12.5000 mg | ORAL_SOLUTION | ORAL | Status: DC | PRN

## 2021-10-18 MED ORDER — ACETAMINOPHEN 500 MG PO TABS
1000.0000 mg | ORAL_TABLET | Freq: Once | ORAL | Status: AC
Start: 2021-10-18 — End: 2021-10-18

## 2021-10-18 MED ORDER — DEXAMETHASONE SODIUM PHOSPHATE 10 MG/ML IJ SOLN
INTRAMUSCULAR | Status: AC
  Filled 2021-10-18: qty 1

## 2021-10-18 MED ORDER — PHENYLEPHRINE HCL (PRESSORS) 10 MG/ML IV SOLN
INTRAVENOUS | Status: AC
Start: 2021-10-18 — End: ?
  Filled 2021-10-18: qty 1

## 2021-10-18 MED ORDER — AMISULPRIDE (ANTIEMETIC) 5 MG/2ML IV SOLN
INTRAVENOUS | Status: AC
  Administered 2021-10-18: 10 mg via INTRAVENOUS
  Filled 2021-10-18: qty 4

## 2021-10-18 MED ORDER — MIDAZOLAM HCL 2 MG/2ML IJ SOLN
INTRAMUSCULAR | Status: DC | PRN
  Administered 2021-10-18: 1 mg via INTRAVENOUS

## 2021-10-18 MED ORDER — HYDROCODONE-ACETAMINOPHEN 7.5-325 MG PO TABS: 1.0000 | ORAL_TABLET | ORAL | Status: DC | PRN

## 2021-10-18 MED ORDER — CEFAZOLIN SODIUM-DEXTROSE 2-4 GM/100ML-% IV SOLN
2.0000 g | Freq: Four times a day (QID) | INTRAVENOUS | Status: DC
  Administered 2021-10-18 – 2021-10-19 (×2): 2 g via INTRAVENOUS
  Filled 2021-10-18 (×2): qty 100

## 2021-10-18 MED ORDER — ROCURONIUM BROMIDE 10 MG/ML (PF) SYRINGE
PREFILLED_SYRINGE | INTRAVENOUS | Status: DC | PRN
  Administered 2021-10-18: 50 mg via INTRAVENOUS

## 2021-10-18 MED ORDER — FENTANYL CITRATE (PF) 250 MCG/5ML IJ SOLN
INTRAMUSCULAR | Status: DC | PRN
Start: 2021-10-18 — End: 2021-10-18
  Administered 2021-10-18 (×3): 50 ug via INTRAVENOUS

## 2021-10-18 MED ORDER — OXYCODONE HCL 5 MG/5ML PO SOLN: 5.0000 mg | Freq: Once | ORAL | Status: DC | PRN

## 2021-10-18 MED ORDER — KETOROLAC TROMETHAMINE 30 MG/ML IJ SOLN: 15.0000 mg | Freq: Once | INTRAMUSCULAR | Status: DC | PRN

## 2021-10-18 MED ORDER — OXYCODONE HCL 5 MG PO TABS
ORAL_TABLET | ORAL | Status: AC
  Filled 2021-10-18: qty 1

## 2021-10-18 MED ORDER — LIDOCAINE 2% (20 MG/ML) 5 ML SYRINGE
INTRAMUSCULAR | Status: DC | PRN
  Administered 2021-10-18: 60 mg via INTRAVENOUS

## 2021-10-18 MED ORDER — CHLORHEXIDINE GLUCONATE 0.12 % MT SOLN: 15.0000 mL | OROMUCOSAL | Status: AC

## 2021-10-18 MED ORDER — ACETAMINOPHEN 325 MG PO TABS: 325.0000 mg | ORAL_TABLET | Freq: Four times a day (QID) | ORAL | Status: DC | PRN

## 2021-10-18 MED ORDER — DOCUSATE SODIUM 100 MG PO CAPS
100.0000 mg | ORAL_CAPSULE | Freq: Two times a day (BID) | ORAL | Status: DC
  Administered 2021-10-19: 100 mg via ORAL
  Filled 2021-10-18: qty 1

## 2021-10-18 SURGICAL SUPPLY — 46 items
BANDAGE ACE 6X5 VEL STRL LF (GAUZE/BANDAGES/DRESSINGS) IMPLANT
BLADE SURG 15 STRL LF DISP TIS (BLADE) ×2 IMPLANT
BLADE SURG 15 STRL SS (BLADE) ×3
BNDG ELASTIC 6X10 VLCR STRL LF (GAUZE/BANDAGES/DRESSINGS) ×1 IMPLANT
CAST PADDING STERILE 4X4 (CAST SUPPLIES) IMPLANT
CHLORAPREP W/TINT 26 (MISCELLANEOUS) ×1 IMPLANT
CONNECTOR 5 IN 1 STRAIGHT STRL (MISCELLANEOUS) IMPLANT
COVER SURGICAL LIGHT HANDLE (MISCELLANEOUS) IMPLANT
CUFF TOURN SGL QUICK 34 (TOURNIQUET CUFF) ×1
CUFF TRNQT CYL 34X4.125X (TOURNIQUET CUFF) ×1 IMPLANT
DRAPE C-ARM 42X72 X-RAY (DRAPES) ×1 IMPLANT
DRAPE C-ARMOR (DRAPES) ×1 IMPLANT
DRAPE EXTREMITY T 121X128X90 (DISPOSABLE) ×1 IMPLANT
DRAPE IMP U-DRAPE 54X76 (DRAPES) ×1 IMPLANT
DRAPE U-SHAPE 47X51 STRL (DRAPES) ×1 IMPLANT
ELECT REM PT RETURN 9FT ADLT (ELECTROSURGICAL) ×1
ELECTRODE REM PT RTRN 9FT ADLT (ELECTROSURGICAL) ×1 IMPLANT
GAUZE 4X4 16PLY RFD (DISPOSABLE) IMPLANT
GAUZE SPONGE 4X4 12PLY STRL (GAUZE/BANDAGES/DRESSINGS) ×1 IMPLANT
GAUZE SPONGE 4X4 12PLY STRL LF (GAUZE/BANDAGES/DRESSINGS) ×1 IMPLANT
GAUZE XEROFORM 1X8 LF (GAUZE/BANDAGES/DRESSINGS) IMPLANT
GLOVE BIOGEL M STRL SZ7.5 (GLOVE) ×1 IMPLANT
GLOVE BIOGEL PI IND STRL 8 (GLOVE) ×1 IMPLANT
GLOVE BIOGEL PI INDICATOR 8 (GLOVE) ×1
GOWN STRL REUS W/ TWL LRG LVL3 (GOWN DISPOSABLE) ×1 IMPLANT
GOWN STRL REUS W/ TWL XL LVL3 (GOWN DISPOSABLE) ×1 IMPLANT
GOWN STRL REUS W/TWL LRG LVL3 (GOWN DISPOSABLE) ×1
GOWN STRL REUS W/TWL XL LVL3 (GOWN DISPOSABLE) ×1
KIT BASIN OR (CUSTOM PROCEDURE TRAY) ×1 IMPLANT
NS IRRIG 1000ML POUR BTL (IV SOLUTION) ×1 IMPLANT
PACK ORTHO EXTREMITY (CUSTOM PROCEDURE TRAY) ×1 IMPLANT
PAD CAST 4YDX4 CTTN HI CHSV (CAST SUPPLIES) ×1 IMPLANT
PADDING CAST COTTON 4X4 STRL (CAST SUPPLIES) ×1
PADDING CAST SYNTHETIC 4X4 STR (CAST SUPPLIES) ×1 IMPLANT
SPIKE FLUID TRANSFER (MISCELLANEOUS) IMPLANT
SPONGE T-LAP 18X18 ~~LOC~~+RFID (SPONGE) IMPLANT
SUCTION FRAZIER HANDLE 10FR (MISCELLANEOUS) ×1
SUCTION TUBE FRAZIER 10FR DISP (MISCELLANEOUS) ×1 IMPLANT
SUT ETHILON 3 0 PS 1 (SUTURE) ×1 IMPLANT
SUT MNCRL AB 3-0 PS2 18 (SUTURE) ×1 IMPLANT
SUT PDS AB 2-0 CT2 27 (SUTURE) ×1 IMPLANT
SYR CONTROL 10ML LL (SYRINGE) IMPLANT
SYSTEM IMPLANT FDL 4.75 (Anchor) IMPLANT
TOWEL GREEN STERILE FF (TOWEL DISPOSABLE) ×2 IMPLANT
TUBE CONNECTING 20X1/4 (TUBING) ×2 IMPLANT
UNDERPAD 30X36 HEAVY ABSORB (UNDERPADS AND DIAPERS) ×1 IMPLANT

## 2021-10-18 NOTE — Anesthesia Procedure Notes (Signed)
Anesthesia Regional Block: Popliteal block   Pre-Anesthetic Checklist: , timeout performed,  Correct Patient, Correct Site, Correct Laterality,  Correct Procedure, Correct Position, site marked,  Risks and benefits discussed,  Surgical consent,  Pre-op evaluation,  At surgeon's request and post-op pain management  Laterality: Right  Prep: chloraprep       Needles:  Injection technique: Single-shot  Needle Type: Echogenic Stimulator Needle          Additional Needles:   Procedures:, nerve stimulator,,,,,     Nerve Stimulator or Paresthesia:  Response: plantar flexion of foot, 0.45 mA  Additional Responses:   Narrative:  Start time: 10/18/2021 2:22 PM End time: 10/18/2021 2:32 PM Injection made incrementally with aspirations every 5 mL.  Performed by: Personally  Anesthesiologist: Achille Rich, MD  Additional Notes: Functioning IV was confirmed and monitors were applied.  A 26mm 21ga Arrow echogenic stimulator needle was used. Sterile prep and drape,hand hygiene and sterile gloves were used.  Negative aspiration and negative test dose prior to incremental administration of local anesthetic. The patient tolerated the procedure well.  Ultrasound guidance: relevent anatomy identified, needle position confirmed, local anesthetic spread visualized around nerve(s), vascular puncture avoided.  Image printed for medical record.

## 2021-10-18 NOTE — H&P (Signed)
PREOPERATIVE H&P  Chief Complaint: Right foot pain  HPI: Sharon Lewis is a 61 y.o. female who presents for preoperative history and physical with a diagnosis of right posterior tibial tendon rupture.  This occurred several months ago.  She had weakness due to the rupture and pain had improved but due to the weakness she requested surgery. Symptoms are rated as moderate to severe, and have been worsening.  This is significantly impairing activities of daily living.  She has elected for surgical management.   Past Medical History:  Diagnosis Date   Anemia    hx   Asthma    Atypical chest pain    Bulging of cervical intervertebral disc    Cystocele    Endometriosis    FH: breast cancer    FH: osteoporosis    FH: ovarian cancer    Fibrocystic breast    High cholesterol    Hypertriglyceridemia    Increased BMI    Menopausal state    Migraines    PONV (postoperative nausea and vomiting)    Rectocele    Seasonal allergies    Stress incontinence    Vaginal atrophy    Vitamin B 12 deficiency    Past Surgical History:  Procedure Laterality Date   APPENDECTOMY     BILATERAL SALPINGOOPHORECTOMY  2009   CAPSULAR RELEASE SHOULDER Right 2019   LAPAROSCOPY     OOPHORECTOMY     VAGINAL HYSTERECTOMY     Social History   Socioeconomic History   Marital status: Married    Spouse name: Not on file   Number of children: Not on file   Years of education: Not on file   Highest education level: Not on file  Occupational History   Not on file  Tobacco Use   Smoking status: Never   Smokeless tobacco: Never  Vaping Use   Vaping Use: Never used  Substance and Sexual Activity   Alcohol use: Yes    Comment: maybe 1 drink every few months   Drug use: No   Sexual activity: Yes    Birth control/protection: Surgical    Comment: hysterectomy  Other Topics Concern   Not on file  Social History Narrative   Not on file   Social Determinants of Health   Financial Resource Strain: Not  on file  Food Insecurity: Not on file  Transportation Needs: Not on file  Physical Activity: Inactive (07/16/2017)   Exercise Vital Sign    Days of Exercise per Week: 0 days    Minutes of Exercise per Session: 0 min  Stress: Not on file  Social Connections: Not on file   Family History  Problem Relation Age of Onset   Breast cancer Sister 34   Ovarian cancer Sister    Diabetes Sister    Breast cancer Maternal Aunt 11       61   Breast cancer Cousin    Osteoporosis Mother    Parkinson's disease Mother    Diabetes Father    Heart disease Father    Colon cancer Paternal Uncle    Breast cancer Cousin    Allergies  Allergen Reactions   Levaquin [Levofloxacin In D5w] Nausea Only   Prior to Admission medications   Medication Sig Start Date End Date Taking? Authorizing Provider  acetaminophen (TYLENOL) 500 MG tablet Take 500-1,000 mg by mouth every 6 (six) hours as needed (pain.).   Yes [provider]  Calcium-Vitamin D-Vitamin K (VIACTIV CALCIUM PLUS D) 650-12.5-40 MG-MCG-MCG CHEW  Chew 2 tablets by mouth in the morning.   Yes [provider]  cyanocobalamin (VITAMIN B12) 1000 MCG tablet Take 2,000 mcg by mouth in the morning.   Yes [provider]  fluticasone (KLS ALLER-FLO) 50 MCG/ACT nasal spray Place 1 spray into both nostrils daily.   Yes [provider]  meloxicam (MOBIC) 15 MG tablet Take 15 mg by mouth daily as needed for pain.   Yes [provider]  Multiple Vitamin (MULTIVITAMIN WITH MINERALS) TABS tablet Take 2 tablets by mouth daily. Thrive Daily Multivitamin   Yes [provider]  Omega-3 Fatty Acids (FISH OIL) 1200 MG CAPS Take 3,600 mg by mouth in the morning.   Yes [provider]     Positive ROS: All other systems have been reviewed and were otherwise negative with the exception of those mentioned in the HPI and as above.  Physical Exam:  Vitals:   10/18/21 1123  BP: (!) 163/92  Pulse: 72  Resp:  17  Temp: 97.6 F (36.4 C)  SpO2: 99%   General: Alert, no acute distress Cardiovascular: No pedal edema Respiratory: No cyanosis, no use of accessory musculature GI: No organomegaly, abdomen is soft and non-tender Skin: No lesions in the area of chief complaint Neurologic: Sensation intact distally Psychiatric: Patient is competent for consent with normal mood and affect Lymphatic: No axillary or cervical lymphadenopathy  MUSCULOSKELETAL: Right foot with weakness on posted testing.  Her foot position is normal with no significant hindfoot valgus or forefoot abduction deformity.  Sensation grossly intact with the exception of some altered sensibility within the deep peroneal nerve distribution which she says started within the last couple of weeks.  Assessment: Right posterior tibial tendon rupture in the setting of relatively normal foot position   Plan: Plan for posterior tibial tendon debridement versus repair versus flexor digitorum longus tendon transfer and tenodesis.  We did discuss the role of calcaneal osteotomy but given that her foot position is normal without significant hindfoot valgus we may not complete that portion of the case..  We discussed the risks, benefits and alternatives of surgery which include but are not limited to wound healing complications, infection, nonunion, malunion, need for further surgery, damage to surrounding structures and continued pain.  They understand there is no guarantees to an acceptable outcome.  After weighing these risks they opted to proceed with surgery.     Terance Hart, MD    10/18/2021 2:21 PM

## 2021-10-18 NOTE — Transfer of Care (Signed)
Immediate Anesthesia Transfer of Care Note  Patient: MAZIKEEN HEHN  Procedure(s) Performed: RIGHT  FOOT MEDIAL DISPLACEMENT CALCANEAL OSTEOTOMY, POSTERIOR TIBIAL TENDON DEBRIDEMENT (Right) CALCANEONAVICULAR LIGAMENT RECONSTRUCTION (Right) FLEXOR DIGITORUM LONGUS TENDON TRANSFER (Right)  Patient Location: PACU  Anesthesia Type:General and Regional  Level of Consciousness: awake and drowsy  Airway & Oxygen Therapy: Patient Spontanous Breathing  Post-op Assessment: Report given to RN, Post -op Vital signs reviewed and stable and Patient moving all extremities X 4  Post vital signs: Reviewed and stable  Last Vitals:  Vitals Value Taken Time  BP 168/85   Temp    Pulse 89 10/18/21 1606  Resp 14 10/18/21 1606  SpO2 94 % 10/18/21 1606  Vitals shown include unvalidated device data.  Last Pain:  Vitals:   10/18/21 1430  TempSrc:   PainSc: 0-No pain      Patients Stated Pain Goal: 0 (10/18/21 1131)  Complications: No notable events documented.

## 2021-10-18 NOTE — Anesthesia Procedure Notes (Signed)
Procedure Name: Intubation Date/Time: 10/18/2021 2:46 PM  Performed by: Nils Pyle, CRNAPre-anesthesia Checklist: Patient identified, Emergency Drugs available, Suction available and Patient being monitored Patient Re-evaluated:Patient Re-evaluated prior to induction Oxygen Delivery Method: Circle System Utilized Preoxygenation: Pre-oxygenation with 100% oxygen Induction Type: IV induction Ventilation: Mask ventilation without difficulty Laryngoscope Size: Miller and 2 Grade View: Grade I Tube type: Oral Tube size: 7.0 mm Number of attempts: 1 Airway Equipment and Method: Stylet and Oral airway Placement Confirmation: ETT inserted through vocal cords under direct vision, positive ETCO2 and breath sounds checked- equal and bilateral Secured at: 22 cm Tube secured with: Tape Dental Injury: Teeth and Oropharynx as per pre-operative assessment

## 2021-10-18 NOTE — Anesthesia Preprocedure Evaluation (Addendum)
Anesthesia Evaluation  Patient identified by MRN, date of birth, ID band Patient awake    Reviewed: Allergy & Precautions, H&P , NPO status , Patient's Chart, lab work & pertinent test results  History of Anesthesia Complications (+) PONV and history of anesthetic complications  Airway Mallampati: II   Neck ROM: full    Dental   Pulmonary asthma ,    breath sounds clear to auscultation       Cardiovascular negative cardio ROS   Rhythm:regular Rate:Normal     Neuro/Psych  Headaches, negative psych ROS   GI/Hepatic negative GI ROS, Neg liver ROS,   Endo/Other  BMI 32  Renal/GU negative Renal ROS  negative genitourinary   Musculoskeletal negative musculoskeletal ROS (+)   Abdominal   Peds  Hematology negative hematology ROS (+)   Anesthesia Other Findings   Reproductive/Obstetrics negative OB ROS                           Anesthesia Physical Anesthesia Plan  ASA: 2  Anesthesia Plan: General and Regional   Post-op Pain Management: Regional block* and Tylenol PO (pre-op)*   Induction: Intravenous  PONV Risk Score and Plan: 4 or greater and Ondansetron, Dexamethasone, Midazolam and Treatment may vary due to age or medical condition  Airway Management Planned: LMA  Additional Equipment: None  Intra-op Plan:   Post-operative Plan: Extubation in OR  Informed Consent: I have reviewed the patients History and Physical, chart, labs and discussed the procedure including the risks, benefits and alternatives for the proposed anesthesia with the patient or authorized representative who has indicated his/her understanding and acceptance.     Dental advisory given  Plan Discussed with: CRNA, Anesthesiologist and Surgeon  Anesthesia Plan Comments:        Anesthesia Quick Evaluation

## 2021-10-18 NOTE — Discharge Instructions (Signed)

## 2021-10-19 DIAGNOSIS — S86111A Strain of other muscle(s) and tendon(s) of posterior muscle group at lower leg level, right leg, initial encounter: Secondary | ICD-10-CM | POA: Diagnosis not present

## 2021-10-19 NOTE — Evaluation (Signed)
Physical Therapy Evaluation & Discharge  Patient Details Name: Sharon Lewis MRN: [id number] DOB: 1960/04/02 Today's Date: 10/19/2021  History of Present Illness  61 y/o female admitted 10/18/21 for R posterior tibial tendon debridement with calcaneonavicular ligrament reconstruction and flexor digitorum longus tendon transfer. No significant PMH  Clinical Impression  Patient admitted following above procedure. Patient lives with husband and was independent prior to sx. Educated patient on NWB status and use of RW and/or knee scooter for mobility. Patient currently functioning at supervision level with use of RW. Good adherence to NWB status throughout mobility. Discussed with patient and husband about utilizing w/c to bump up backwards into house to negotiate the 1-2 stairs she has to access home, both verbalized understanding. Answered all questions pertaining to mobility. No further skilled PT needs required acutely. No PT follow up recommended at this time, however will benefit from either HHPT or OPPT once cleared to [facility].      Recommendations for follow up therapy are one component of a multi-disciplinary discharge planning process, led by the attending physician.  Recommendations may be updated based on patient status, additional functional criteria and insurance authorization.  Follow Up Recommendations No PT follow up (until cleared to [facility])      Assistance Recommended at Discharge PRN  Patient can return home with the following  Help with stairs or ramp for entrance;A little help with walking and/or transfers;Assist for transportation    Equipment Recommendations None recommended by PT  Recommendations for Other Services       Functional Status Assessment Patient has had a recent decline in their functional status and demonstrates the ability to make significant improvements in function in a reasonable and predictable amount of time.     Precautions / Restrictions  Precautions Precautions: None Restrictions Weight Bearing Restrictions: Yes RLE Weight Bearing: Non weight bearing      Mobility  Bed Mobility Overal bed mobility: Modified Independent                  Transfers Overall transfer level: Needs assistance Equipment used: Rolling Sharon Lewis (2 wheels) Transfers: Sit to/from Stand Sit to Stand: Supervision                Ambulation/Gait Ambulation/Gait assistance: Supervision Gait Distance (Feet): 20 Feet Assistive device: Rolling Sharon Lewis (2 wheels) Gait Pattern/deviations: Step-to pattern Gait velocity: decreased     General Gait Details: hop to pattern  Stairs            Wheelchair Mobility    Modified Rankin (Stroke Patients Only)       Balance Overall balance assessment: Mild deficits observed, not formally tested                                           Pertinent Vitals/Pain Pain Assessment Pain Assessment: No/denies pain    Home Living Family/patient expects to be discharged to:: Private residence Living Arrangements: Spouse/significant other Available Help at Discharge: Family Type of Home: House Home Access: Stairs to enter   Secretary/administrator of Steps: 2   Home Layout: One level Home Equipment: Agricultural consultant (2 wheels);Rollator (4 wheels);Wheelchair - manual;BSC/3in1;Shower seat - built in Archivist)      Prior Function Prior Level of Function : Independent/Modified Independent;Driving                     Hand Dominance  Extremity/Trunk Assessment   Upper Extremity Assessment Upper Extremity Assessment: Defer to OT evaluation    Lower Extremity Assessment Lower Extremity Assessment: RLE deficits/detail RLE Deficits / Details: s/p R ankle lig reconstruction       Communication   Communication: No difficulties  Cognition Arousal/Alertness: Awake/alert Behavior During Therapy: WFL for tasks assessed/performed Overall Cognitive  Status: Within Functional Limits for tasks assessed                                          General Comments      Exercises     Assessment/Plan    PT Assessment Patient does not need any further PT services  PT Problem List         PT Treatment Interventions      PT Goals (Current goals can be found in the Care Plan section)  Acute Rehab PT Goals Patient Stated Goal: to go home PT Goal Formulation: All assessment and education complete, DC therapy    Frequency       Co-evaluation               AM-PAC PT "6 Clicks" Mobility  Outcome Measure Help needed turning from your back to your side while in a flat bed without using bedrails?: None Help needed moving from lying on your back to sitting on the side of a flat bed without using bedrails?: None Help needed moving to and from a bed to a chair (including a wheelchair)?: A Little Help needed standing up from a chair using your arms (e.g., wheelchair or bedside chair)?: A Little Help needed to walk in hospital room?: A Little Help needed climbing 3-5 steps with a railing? : A Little 6 Click Score: 20    End of Session Equipment Utilized During Treatment: Gait belt Activity Tolerance: Patient tolerated treatment well Patient left: in bed;with call bell/phone within reach Nurse Communication: Mobility status PT Visit Diagnosis: Muscle weakness (generalized) (M62.81)    Time: 5427-0623 PT Time Calculation (min) (ACUTE ONLY): 29 min   Charges:   PT Evaluation $PT Eval Low Complexity: 1 Low          Sharon Lewis PT, DPT Acute Rehabilitation Services Office [phone]   Sharon Lewis 10/19/2021, 9:32 AM

## 2021-10-19 NOTE — Progress Notes (Signed)
Went over d/c instructions with pt and spouse. No questions at this time.

## 2021-10-19 NOTE — Anesthesia Postprocedure Evaluation (Signed)
Anesthesia Post Note  Patient: Sharon Lewis  Procedure(s) Performed: RIGHT  FOOT MEDIAL DISPLACEMENT CALCANEAL OSTEOTOMY, POSTERIOR TIBIAL TENDON DEBRIDEMENT (Right) CALCANEONAVICULAR LIGAMENT RECONSTRUCTION (Right) FLEXOR DIGITORUM LONGUS TENDON TRANSFER (Right)     Patient location during evaluation: PACU Anesthesia Type: Regional and General Level of consciousness: awake and alert Pain management: pain level controlled Vital Signs Assessment: post-procedure vital signs reviewed and stable Respiratory status: spontaneous breathing, nonlabored ventilation, respiratory function stable and patient connected to nasal cannula oxygen Cardiovascular status: blood pressure returned to baseline and stable Postop Assessment: no apparent nausea or vomiting Anesthetic complications: no   No notable events documented.  Last Vitals:  Vitals:   10/19/21 0626 10/19/21 0709  BP: (!) 144/89 125/83  Pulse: (!) 115 91  Resp: 18 19  Temp: 36.9 C 36.8 C  SpO2: 97% 94%    Last Pain:  Vitals:   10/19/21 0709  TempSrc: Oral  PainSc:                  Sergei Delo S

## 2021-10-19 NOTE — Evaluation (Signed)
Occupational Therapy Evaluation Patient Details Name: Sharon Lewis MRN: [id number] DOB: 1961/02/05 Today's Date: 10/19/2021   History of Present Illness 61 y/o female admitted 10/18/21 for R posterior tibial tendon debridement with calcaneonavicular ligrament reconstruction and flexor digitorum longus tendon transfer. No significant PMH   Clinical Impression   Prior to this admission, patient living independently with husband, still driving, and completing all ADLs and IADLs independently. Patient with excellent adherence to NWB status, and able to complete all ADLs, transfers, and ambulation at supervision level. Education provided with regard to toileting and dressing and managing NWB status with good demonstration and acknowledgement noted. Patient with no further OT needs, will defer to surgeon for rehab protocol once NWB status is complete. Patient is discharged from OT; please re-consult if further acute OT needs arise.      Recommendations for follow up therapy are one component of a multi-disciplinary discharge planning process, led by the attending physician.  Recommendations may be updated based on patient status, additional functional criteria and insurance authorization.   Follow Up Recommendations  No OT follow up    Assistance Recommended at Discharge PRN  Patient can return home with the following A little help with walking and/or transfers;Help with stairs or ramp for entrance    Functional Status Assessment  Patient has had a recent decline in their functional status and demonstrates the ability to make significant improvements in function in a reasonable and predictable amount of time.  Equipment Recommendations  None recommended by OT (Patient has DME needed)    Recommendations for Other Services       Precautions / Restrictions Precautions Precautions: None Restrictions Weight Bearing Restrictions: Yes RLE Weight Bearing: Non weight bearing      Mobility Bed  Mobility Overal bed mobility: Modified Independent                  Transfers Overall transfer level: Needs assistance Equipment used: Rolling walker (2 wheels) Transfers: Sit to/from Stand Sit to Stand: Supervision                  Balance Overall balance assessment: Mild deficits observed, not formally tested                                         ADL either performed or assessed with clinical judgement   ADL Overall ADL's : Needs assistance/impaired                                     Functional mobility during ADLs: Supervision/safety General ADL Comments: Patient at supervision level for all ADLs, education provided with regard to toileting and managing NWB status with good demonstration and acknowledgement noted     Vision Baseline Vision/History: 1 Wears glasses Ability to See in Adequate Light: 0 Adequate Patient Visual Report: No change from baseline       Perception     Praxis      Pertinent Vitals/Pain Pain Assessment Pain Assessment: No/denies pain     Hand Dominance     Extremity/Trunk Assessment Upper Extremity Assessment Upper Extremity Assessment: Overall WFL for tasks assessed   Lower Extremity Assessment Lower Extremity Assessment: Defer to PT evaluation RLE Deficits / Details: s/p R ankle lig reconstruction       Communication Communication Communication: No difficulties  Cognition Arousal/Alertness: Awake/alert Behavior During Therapy: WFL for tasks assessed/performed Overall Cognitive Status: Within Functional Limits for tasks assessed                                       General Comments  VSS on RA    Exercises     Shoulder Instructions      Home Living Family/patient expects to be discharged to:: Private residence Living Arrangements: Spouse/significant other Available Help at Discharge: Family Type of Home: House Home Access: Stairs to enter Water quality scientist of Steps: 2   Home Layout: One level     Bathroom Shower/Tub: Producer, television/film/video: Handicapped height     Home Equipment: Agricultural consultant (2 wheels);Rollator (4 wheels);Wheelchair - manual;BSC/3in1;Shower seat - built in Archivist)          Prior Functioning/Environment Prior Level of Function : Independent/Modified Independent;Driving               ADLs Comments: Independent        OT Problem List: Decreased activity tolerance      OT Treatment/Interventions:      OT Goals(Current goals can be found in the care plan section) Acute Rehab OT Goals Patient Stated Goal: to get this rehab over with OT Goal Formulation: With patient Time For Goal Achievement: 11/02/21 Potential to Achieve Goals: Good  OT Frequency:      Co-evaluation              AM-PAC OT "6 Clicks" Daily Activity     Outcome Measure Help from another person eating meals?: None Help from another person taking care of personal grooming?: None Help from another person toileting, which includes using toliet, bedpan, or urinal?: None Help from another person bathing (including washing, rinsing, drying)?: None Help from another person to put on and taking off regular upper body clothing?: None Help from another person to put on and taking off regular lower body clothing?: None 6 Click Score: 24   End of Session Equipment Utilized During Treatment: Gait belt;Rolling walker (2 wheels);Other (comment) (Knee scooter) Nurse Communication: Mobility status  Activity Tolerance: Patient tolerated treatment well Patient left: with call bell/phone within reach;with family/visitor present;in bed  OT Visit Diagnosis: Unsteadiness on feet (R26.81)                Time: 9163-8466 OT Time Calculation (min): 29 min Charges:  OT General Charges $OT Visit: 1 Visit OT Evaluation $OT Eval Low Complexity: 1 Low  Pollyann Glen E. Shadonna Benedick, OTR/L Acute Rehabilitation  Services [phone]   Cherlyn Cushing 10/19/2021, 10:14 AM

## 2021-10-19 NOTE — Progress Notes (Signed)
     Sharon Lewis is a 61 y.o. female   Orthopaedic diagnosis: Right posterior tibial tendon rupture  Surgery: Right flexor digitorum longus transfer and posterior tibial tendon tenodesis 10/18/2021  Subjective: Patient appears comfortable in bed.  She is originally planning for outpatient surgery, but opted to stay overnight as she developed pain in the PACU postoperatively yesterday.  At present, she states Sharon pain has improved.  She states the nerve block is intact, although she does have some pain medially.  She has maintained nonweightbearing.  She is seen today with Sharon Lewis.  She is hoping to be discharged soon as Sharon pain improved overnight.   Objectyive: Vitals:   10/19/21 0626 10/19/21 0709  BP: (!) 144/89 125/83  Pulse: (!) 115 91  Resp: 18 19  Temp: 98.5 F (36.9 C) 98.2 F (36.8 C)  SpO2: 97% 94%     Exam: Awake and alert Respirations even and unlabored No acute distress  Right lower extremity demonstrates well fitted lower extremity splint.  The foot is held in a slightly inverted position in the splint.  Exposed skin distal and proximal to the splint appears benign.  She remains unable to wiggle Sharon toes secondary to intact nerve block.  The toes are warm and well-perfused distally.  The splint was not removed.  Sharon calf is soft and nontender.    Assessment: Postop day 1 suitable for discharge once cleared by physical therapy.  Plan: -Nonweightbearing right lower extremity -Keep splint clean, dry, and intact -Oxycodone for pain control - already filled  -Full-strength aspirin daily x1 month for DVT prophylaxis - already filled  Plan for discharge home to the care of Sharon Lewis with outpatient follow-up with Dr. Susa Simmonds 2 weeks from surgery for splint removal and suture removal if appropriate.  She will likely be placed into a formal short leg cast at that time.  She understands she will be nonweightbearing for 6 to 8 weeks.   Jude Linck J. [place], PA-C

## 2021-10-22 ENCOUNTER — Encounter (HOSPITAL_COMMUNITY): Payer: Self-pay | Admitting: Orthopaedic Surgery

## 2021-10-22 NOTE — Op Note (Signed)
Sharon Lewis female 61 y.o. 10/18/2021  PreOperative Diagnosis: Posterior tibial tendon tear, right   PostOperative Diagnosis: Posterior tibial tendon tear, right  PROCEDURE: Right posterior tibial tendon debridement Flexor digitorum longus longus tendon transfer to navicular tuberosity  SURGEON: Dub Mikes, MD  ASSISTANT: Jesse [place], PA-C: His assistance was necessary for prep and drape, exposure, holding retractors, transfer of the tendon, wound closure and splinting.   ANESTHESIA: general with peripheral nerve block  FINDINGS: Torn and insufficient posterior tibial tendon  IMPLANTS: Bio-Tenodesis screw  INDICATIONS:60 y.o. femalesustained injury to her posterior tibial tendon.  She has significant weakness and tearing along the tendon.  MRI revealed torn posterior tibial tendon.  In standing her foot position was well maintained without significant pes planovalgus.  Arch was maintained.   Patient understood the risks, benefits and alternatives to surgery which include but are not limited to wound healing complications, infection, nonunion, malunion, need for further surgery as well as damage to surrounding structures. They also understood the potential for continued pain in that there were no guarantees of acceptable outcome After weighing these risks the patient opted to proceed with surgery.  PROCEDURE: Patient was identified in the preoperative holding area.  The right foot was marked by myself.  Consent was signed by myself and the patient.  Block was performed by anesthesia in the preoperative holding area.  Patient was taken to the operative suite and placed supine on the operative table.  General LMA anesthesia was induced without difficulty. Bump was placed under the operative hip and bone foam was used.  All bony prominences were well padded.  Tourniquet was placed on the operative thigh.  Preoperative antibiotics were given. The extremity was prepped and draped  in the usual sterile fashion and surgical timeout was performed.  The limb was elevated and the tourniquet was inflated to 250 mmHg.  We began making a longitudinal incision overlying the course of the posterior tibial tendon from the medial malleolus down to the navicular tuberosity.  The incision was carried sharply down through skin and subcutaneous tissue.  Blunt dissection was used to mobilize skin flaps.  The sheath overlying the posterior tibial tendon was identified and incised in line with the tendon.  There was tearing and attenuation of the tendon at the level of the medial malleolus.  Distally it was then removed from the navicular tuberosity.  The tendon was retracted using a Coker and the tendon was transected proximal to the level of the tearing.  The tendon material was then discarded.  The calcaneonavicular ligament was then inspected and found to be in continuity without any evidence of injury.  We then proceeded with separate deep incision to gain access to the flexor digitorum longus tendon.  The sheath about the level of the medial malleolus was incised and opened along the course of the tendon.  Then distally the tendon was transected just proximal to the knot of Sharon Lewis.  Then using a fiber loop the tendon was grasped.  The foot was held in an inverted position in the appropriate position of the tendon was identified.  The tendon was sutured.  Then guidewire within the navicular tuberosity was placed and reamed.  Then the tendon was passed through the Bio-Tenodesis tunnel. Then a Bio-Tenodesis screw was placed without difficulty.  This was done with the foot held in an inverted and abducted position.  After transfer of the tendon the foot position was maintained.  Then the wound was irrigated.  Fluoroscopic images were  obtained.  The retinacular tissue was closed overlying the tendon using a 2-0 Vicryl suture.  Wound was closed using 3-0 Monocryl and 3-0 nylon.  Soft dressing was  placed.  Short leg splint was placed.     Tourniquet was released.  She was then awakened from anesthesia and taken recovery in stable condition.  POST OPERATIVE INSTRUCTIONS: Nonweightbearing to operative extremity Follow-up in 2 weeks for splint removal, suture removal, x-rays of the foot nonweightbearing. She will be placed in a short leg cast  TOURNIQUET TIME:less than one hour  BLOOD LOSS:  Minimal         DRAINS: none         SPECIMEN: none       COMPLICATIONS:  * No complications entered in OR log *         Disposition: PACU - hemodynamically stable.         Condition: stable

## 2022-03-07 ENCOUNTER — Other Ambulatory Visit: Payer: Self-pay | Admitting: Internal Medicine

## 2022-03-07 DIAGNOSIS — Z1231 Encounter for screening mammogram for malignant neoplasm of breast: Secondary | ICD-10-CM

## 2022-04-22 ENCOUNTER — Ambulatory Visit
Admission: RE | Admit: 2022-04-22 | Discharge: 2022-04-22 | Disposition: A | Discharge status: Home / Self Care | Payer: 59 | Source: Ambulatory Visit | Attending: Internal Medicine | Admitting: Internal Medicine

## 2022-04-22 DIAGNOSIS — Z1231 Encounter for screening mammogram for malignant neoplasm of breast: Secondary | ICD-10-CM | POA: Diagnosis present

## 2022-04-25 ENCOUNTER — Other Ambulatory Visit: Payer: Self-pay | Admitting: Internal Medicine

## 2022-04-25 DIAGNOSIS — R928 Other abnormal and inconclusive findings on diagnostic imaging of breast: Secondary | ICD-10-CM

## 2022-04-25 DIAGNOSIS — N6489 Other specified disorders of breast: Secondary | ICD-10-CM

## 2022-05-01 ENCOUNTER — Ambulatory Visit
Admission: RE | Admit: 2022-05-01 | Discharge: 2022-05-01 | Disposition: A | Discharge status: Home / Self Care | Payer: 59 | Source: Ambulatory Visit | Attending: Internal Medicine | Admitting: Internal Medicine

## 2022-05-01 DIAGNOSIS — R928 Other abnormal and inconclusive findings on diagnostic imaging of breast: Secondary | ICD-10-CM | POA: Diagnosis present

## 2022-05-01 DIAGNOSIS — Z1239 Encounter for other screening for malignant neoplasm of breast: Secondary | ICD-10-CM | POA: Diagnosis not present

## 2022-05-01 DIAGNOSIS — N6489 Other specified disorders of breast: Secondary | ICD-10-CM | POA: Insufficient documentation

## 2022-05-01 DIAGNOSIS — R92322 Mammographic fibroglandular density, left breast: Secondary | ICD-10-CM | POA: Diagnosis not present

## 2022-06-20 ENCOUNTER — Other Ambulatory Visit: Payer: Self-pay | Admitting: Internal Medicine

## 2022-06-20 DIAGNOSIS — E785 Hyperlipidemia, unspecified: Secondary | ICD-10-CM

## 2022-06-25 ENCOUNTER — Ambulatory Visit
Admission: RE | Admit: 2022-06-25 | Discharge: 2022-06-25 | Disposition: A | Discharge status: Home / Self Care | Payer: Self-pay | Source: Ambulatory Visit | Attending: Internal Medicine | Admitting: Internal Medicine

## 2022-06-25 DIAGNOSIS — E785 Hyperlipidemia, unspecified: Secondary | ICD-10-CM | POA: Insufficient documentation

## 2023-03-14 ENCOUNTER — Other Ambulatory Visit: Payer: Self-pay | Admitting: Internal Medicine

## 2023-03-14 DIAGNOSIS — Z1231 Encounter for screening mammogram for malignant neoplasm of breast: Secondary | ICD-10-CM

## 2023-04-24 ENCOUNTER — Ambulatory Visit
Admission: RE | Admit: 2023-04-24 | Discharge: 2023-04-24 | Disposition: A | Discharge status: Home / Self Care | Payer: 59 | Source: Ambulatory Visit | Attending: Internal Medicine | Admitting: Internal Medicine

## 2023-04-24 DIAGNOSIS — Z1231 Encounter for screening mammogram for malignant neoplasm of breast: Secondary | ICD-10-CM | POA: Insufficient documentation

## 2023-08-19 IMAGING — MG MM DIGITAL SCREENING BILAT W/ TOMO AND CAD
8 series · 8 of 24 positions shown · non-contrast
Comparison: Previous exam(s).

CLINICAL DATA: Screening.

EXAM:
DIGITAL SCREENING BILATERAL MAMMOGRAM WITH TOMOSYNTHESIS AND CAD
TECHNIQUE: Bilateral screening digital craniocaudal and mediolateral oblique
mammograms were obtained. Bilateral screening digital breast
tomosynthesis was performed. The images were evaluated with
computer-aided detection.

[L CC synth-2D]
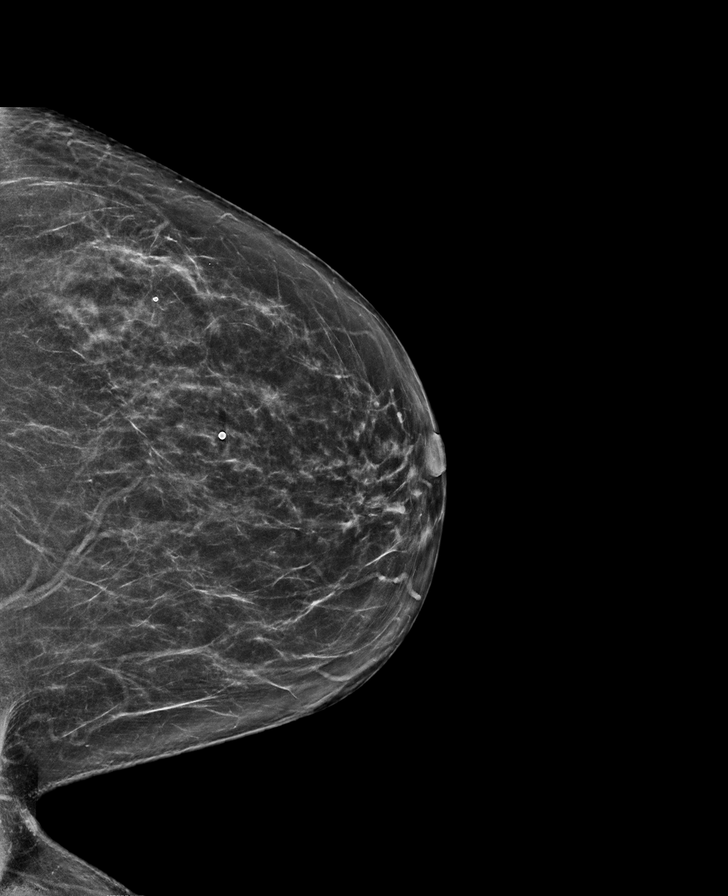

[R CC synth-2D]
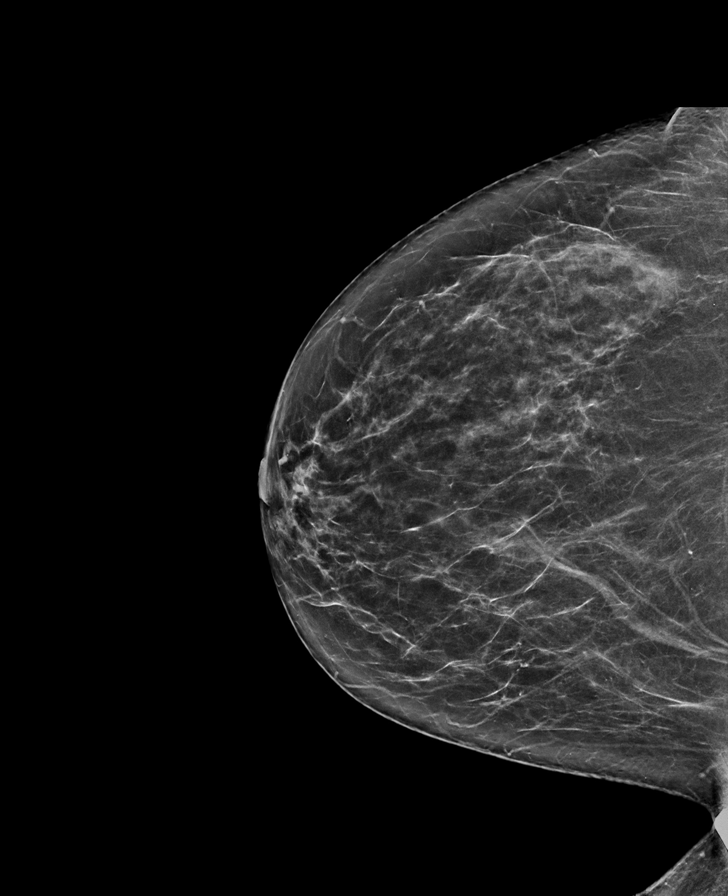

[L MLO synth-2D]
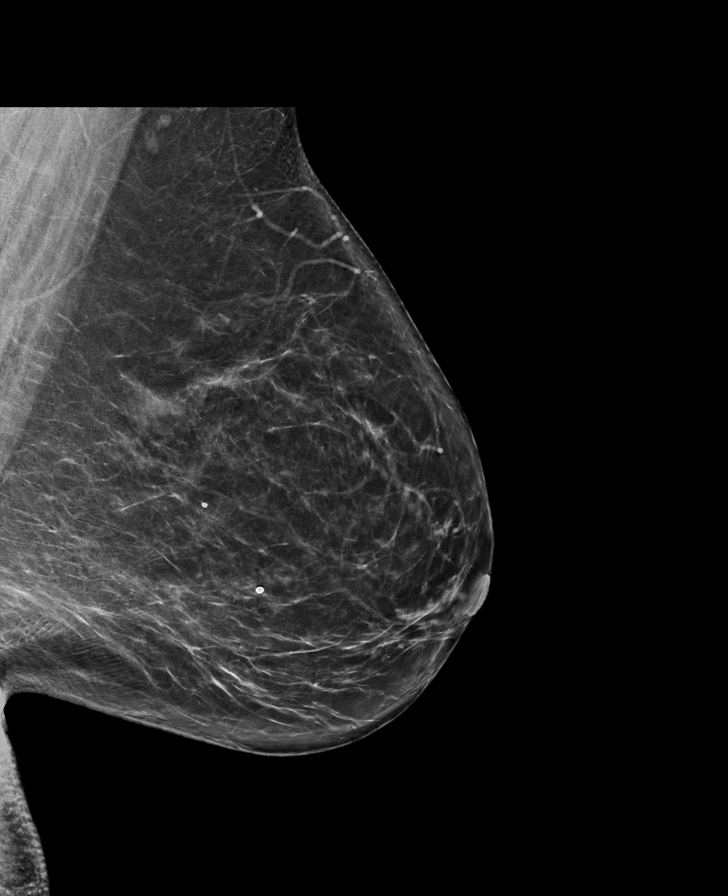

[R MLO synth-2D]
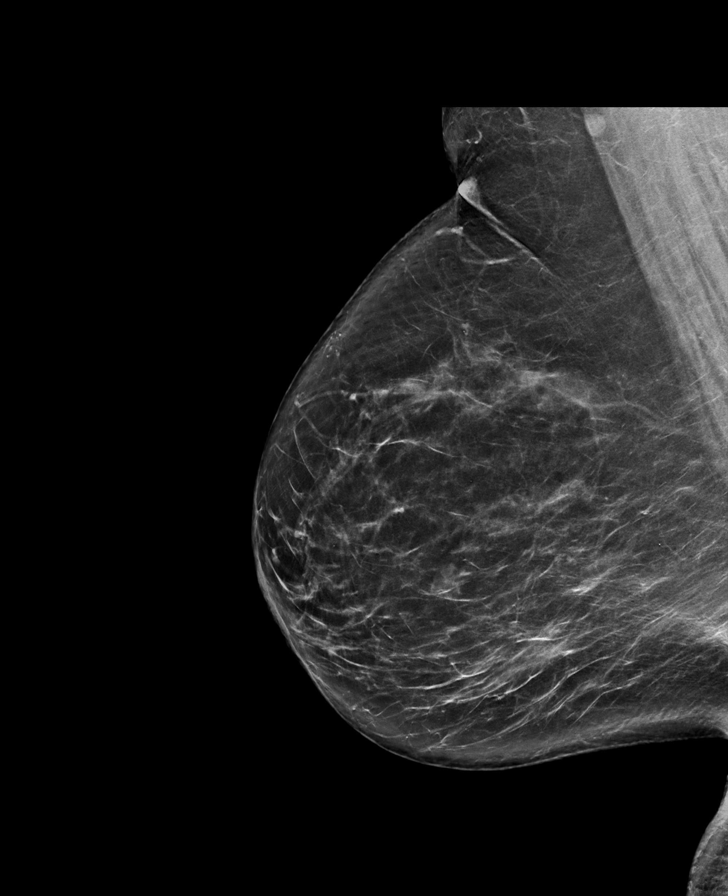

[L CC tomo · tomo slice 35/70.0]
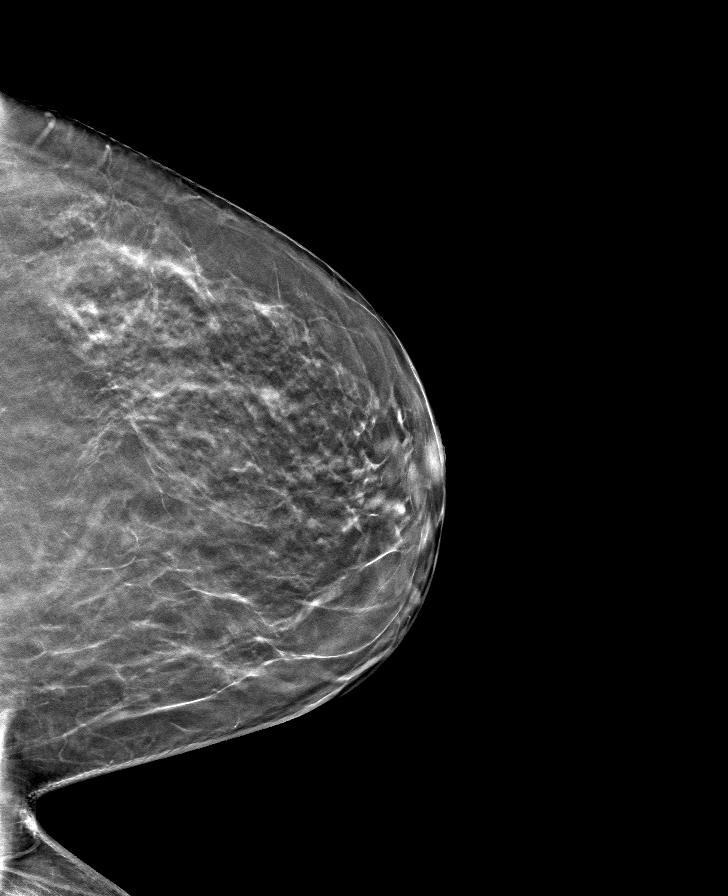

[L MLO tomo · tomo slice 40/79.0]
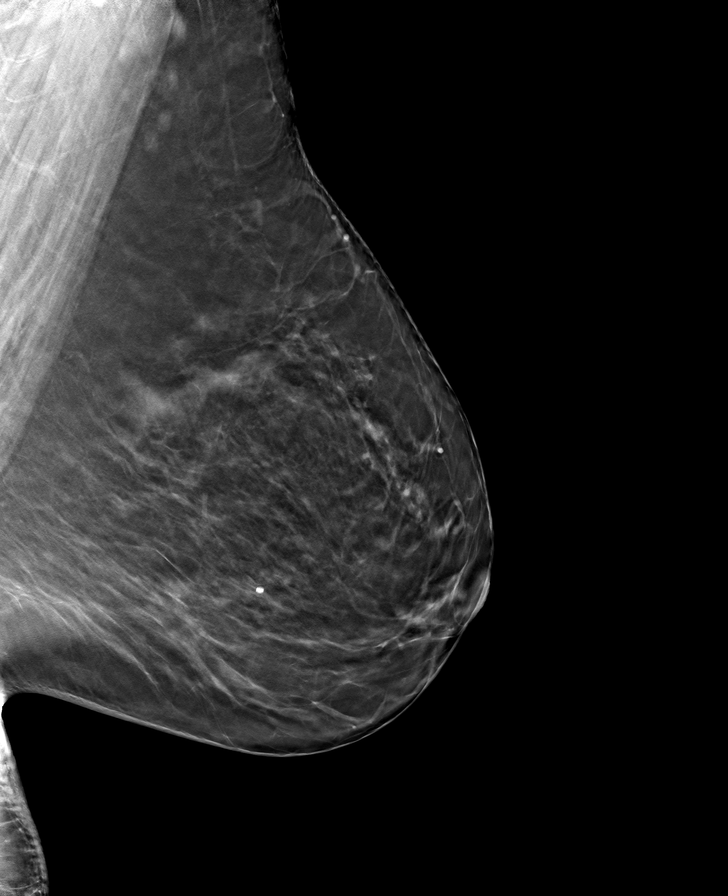

[R CC tomo · tomo slice 35/70.0]
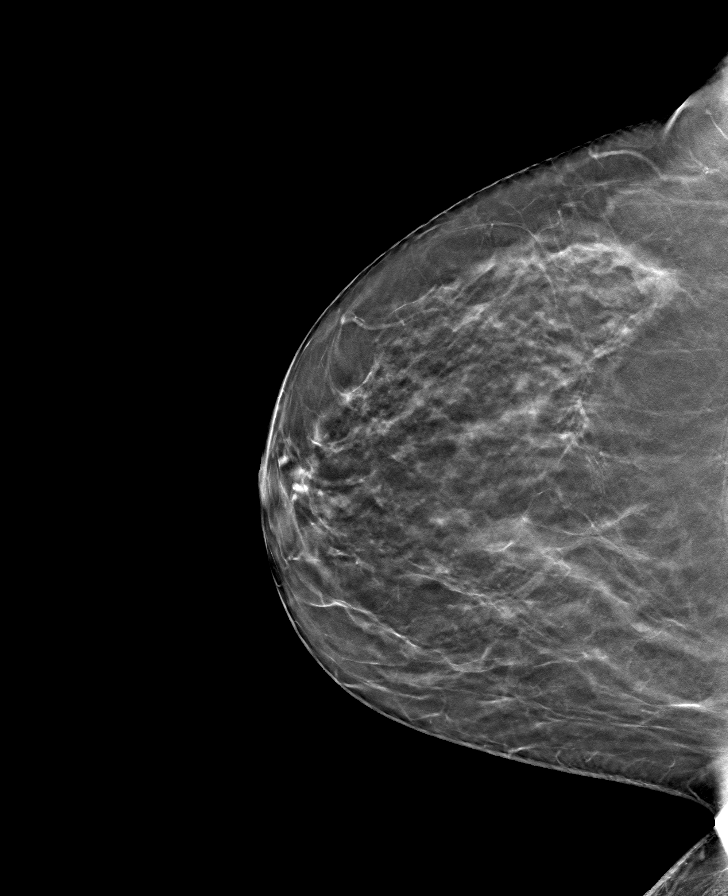

[R MLO tomo · tomo slice 42/83.0]
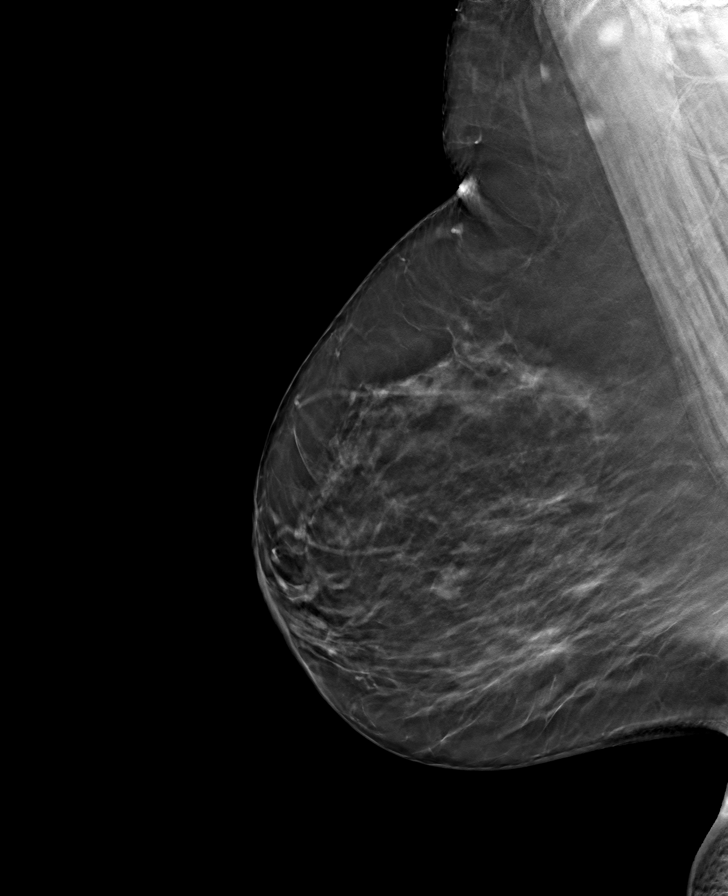

[8 of 24 positions shown; findings below may reference images not displayed]

ACR Breast Density Category b: There are scattered areas of
fibroglandular density.
FINDINGS: There are no findings suspicious for malignancy.
IMPRESSION: No mammographic evidence of malignancy. A result letter of this
screening mammogram will be mailed directly to the patient.

RECOMMENDATION:
Screening mammogram in one year. (Code:51-O-LD2)

BI-RADS CATEGORY  1: Negative.

## 2024-02-09 ENCOUNTER — Telehealth: Payer: Self-pay

## 2024-02-09 ENCOUNTER — Other Ambulatory Visit: Payer: Self-pay

## 2024-02-09 DIAGNOSIS — Z1211 Encounter for screening for malignant neoplasm of colon: Secondary | ICD-10-CM

## 2024-02-09 MED ORDER — NA SULFATE-K SULFATE-MG SULF 17.5-3.13-1.6 GM/177ML PO SOLN: 354.0000 mL | Freq: Once | ORAL | 0 refills | Status: AC

## 2024-02-09 NOTE — Telephone Encounter (Signed)
 Gastroenterology Pre-Procedure Review  Request Date: 04/02/2024 Requesting Physician: Dr. Melany  PATIENT REVIEW QUESTIONS: The patient responded to the following health history questions as indicated:    1. Are you having any GI issues? no 2. Do you have a personal history of Polyps? no 3. Do you have a family history of Colon Cancer or Polyps? no 4. Diabetes Mellitus? no 5. Joint replacements in the past 12 months?no 6. Major health problems in the past 3 months?no 7. Any artificial heart valves, MVP, or defibrillator?no    MEDICATIONS & ALLERGIES:    Patient reports the following regarding taking any anticoagulation/antiplatelet therapy:   Plavix, Coumadin, Eliquis, Xarelto, Lovenox, Pradaxa, Brilinta, or Effient? no Aspirin ? yes (ASA 81 MG)  Patient confirms/reports the following medications:  Current Outpatient Medications  Medication Sig Dispense Refill   acetaminophen  (TYLENOL ) 500 MG tablet Take 500-1,000 mg by mouth every 6 (six) hours as needed (pain.).     Calcium-Vitamin D-Vitamin K (VIACTIV CALCIUM PLUS D) 650-12.5-40 MG-MCG-MCG CHEW Chew 2 tablets by mouth in the morning.     cyanocobalamin  (VITAMIN B12) 1000 MCG tablet Take 2,000 mcg by mouth in the morning.     fluticasone (KLS ALLER-FLO) 50 MCG/ACT nasal spray Place 1 spray into both nostrils daily.     meloxicam (MOBIC) 15 MG tablet Take 15 mg by mouth daily as needed for pain.     Multiple Vitamin (MULTIVITAMIN WITH MINERALS) TABS tablet Take 2 tablets by mouth daily. Thrive Daily Multivitamin     Omega-3 Fatty Acids (FISH OIL) 1200 MG CAPS Take 3,600 mg by mouth in the morning.     No current facility-administered medications for this visit.    Patient confirms/reports the following allergies:  Allergies[1]  No orders of the defined types were placed in this encounter.   AUTHORIZATION INFORMATION Primary Insurance: 1D#: Group #:  Secondary Insurance: 1D#: Group #:  SCHEDULE INFORMATION: Date:  04/02/2024 Time: Location: [facility] Dr. Melany     [1]  Allergies Allergen Reactions   Levaquin [Levofloxacin In D5w] Nausea Only

## 2024-03-30 ENCOUNTER — Other Ambulatory Visit: Payer: Self-pay | Admitting: Internal Medicine

## 2024-03-30 DIAGNOSIS — Z1231 Encounter for screening mammogram for malignant neoplasm of breast: Secondary | ICD-10-CM

## 2024-04-02 ENCOUNTER — Encounter: Payer: Self-pay | Admitting: Gastroenterology

## 2024-04-02 ENCOUNTER — Ambulatory Visit
Admission: RE | Admit: 2024-04-02 | Discharge: 2024-04-02 | Disposition: A | Payer: Self-pay | Source: Home / Self Care | Attending: Gastroenterology | Admitting: Gastroenterology

## 2024-04-02 ENCOUNTER — Encounter: Admission: RE | Disposition: A | Payer: Self-pay | Source: Home / Self Care | Attending: Gastroenterology

## 2024-04-02 ENCOUNTER — Encounter: Payer: Self-pay | Admitting: Anesthesiology

## 2024-04-02 ENCOUNTER — Other Ambulatory Visit: Payer: Self-pay

## 2024-04-02 DIAGNOSIS — Z1211 Encounter for screening for malignant neoplasm of colon: Secondary | ICD-10-CM

## 2024-04-02 DIAGNOSIS — K635 Polyp of colon: Secondary | ICD-10-CM

## 2024-04-02 MED ORDER — PROPOFOL 10 MG/ML IV BOLUS
INTRAVENOUS | Status: DC | PRN
  Administered 2024-04-02 (×2): 30 mg via INTRAVENOUS
  Administered 2024-04-02: 20 mg via INTRAVENOUS
  Administered 2024-04-02: 30 mg via INTRAVENOUS
  Administered 2024-04-02: 80 mg via INTRAVENOUS
  Administered 2024-04-02: 30 mg via INTRAVENOUS

## 2024-04-02 MED ORDER — DEXMEDETOMIDINE HCL IN NACL 80 MCG/20ML IV SOLN
INTRAVENOUS | Status: DC | PRN
  Administered 2024-04-02: 6 ug via INTRAVENOUS

## 2024-04-02 MED ORDER — ONDANSETRON HCL 4 MG/2ML IJ SOLN
INTRAMUSCULAR | Status: AC
  Filled 2024-04-02: qty 2

## 2024-04-02 MED ORDER — LACTATED RINGERS IV SOLN: INTRAVENOUS | Status: DC

## 2024-04-02 MED ORDER — LIDOCAINE HCL (CARDIAC) PF 100 MG/5ML IV SOSY
PREFILLED_SYRINGE | INTRAVENOUS | Status: DC | PRN
  Administered 2024-04-02: 40 mg via INTRAVENOUS

## 2024-04-02 MED ORDER — DEXMEDETOMIDINE HCL IN NACL 80 MCG/20ML IV SOLN
INTRAVENOUS | Status: AC
  Filled 2024-04-02: qty 20

## 2024-04-02 MED ORDER — SODIUM CHLORIDE 0.9 % IV SOLN: INTRAVENOUS | Status: DC

## 2024-04-02 MED ORDER — ONDANSETRON HCL 4 MG/2ML IJ SOLN
INTRAMUSCULAR | Status: DC | PRN
  Administered 2024-04-02: 4 mg via INTRAVENOUS

## 2024-04-02 MED ORDER — STERILE WATER FOR IRRIGATION IR SOLN
Status: DC | PRN
  Administered 2024-04-02: 1

## 2024-04-02 NOTE — Transfer of Care (Signed)
 Immediate Anesthesia Transfer of Care Note  Patient: Sharon Lewis  Procedure(s) Performed: COLONOSCOPY POLYPECTOMY, INTESTINE (Rectum)  Patient Location: PACU  Anesthesia Type: General  Level of Consciousness: awake, alert  and patient cooperative  Airway and Oxygen Therapy: Patient Spontanous Breathing and Patient connected to supplemental oxygen  Post-op Assessment: Post-op Vital signs reviewed, Patient's Cardiovascular Status Stable, Respiratory Function Stable, Patent Airway and No signs of Nausea or vomiting  Post-op Vital Signs: Reviewed and stable  Complications: No notable events documented.

## 2024-04-02 NOTE — H&P (Signed)
 "  Clotilda Schaffer, MD  [address] [place], [place] [address] Phone: [phone] Fax : [phone]  Primary Care Physician:  Fernande Ophelia JINNY DOUGLAS, MD Primary Gastroenterologist:  Dr. Schaffer  Pre-Procedure History & Physical: HPI:  Sharon Lewis is a 64 y.o. female is here for a screening colonoscopy.  Prior colonoscopy? 2015, negative Fhx CRC? No Blood thinners? No. ASA was held 1 week ago  Past Medical History:  Diagnosis Date   Anemia    hx   Asthma    Atypical chest pain    Bulging of cervical intervertebral disc    Cystocele    Endometriosis    FH: breast cancer    FH: osteoporosis    FH: ovarian cancer    Fibrocystic breast    High cholesterol    Hypertriglyceridemia    Increased BMI    Menopausal state    Migraines    PONV (postoperative nausea and vomiting)    Rectocele    Seasonal allergies    Stress incontinence    Vaginal atrophy    Vitamin B 12 deficiency     Past Surgical History:  Procedure Laterality Date   APPENDECTOMY     BILATERAL SALPINGOOPHORECTOMY  2009   CALCANEAL OSTEOTOMY Right 10/18/2021   Procedure: RIGHT  FOOT MEDIAL DISPLACEMENT CALCANEAL OSTEOTOMY, POSTERIOR TIBIAL TENDON DEBRIDEMENT;  Surgeon: Elsa Lonni SAUNDERS, MD;  Location: [facility] OR;  Service: Orthopedics;  Laterality: Right;  LENGTH OF SURGERY: 120 MINUTES   CAPSULAR RELEASE SHOULDER Right 2019   LAPAROSCOPY     OOPHORECTOMY     RECONSTRUCTION OF CORACOCLAVICULAR LIGAMENT Right 10/18/2021   Procedure: CALCANEONAVICULAR LIGAMENT RECONSTRUCTION;  Surgeon: Elsa Lonni SAUNDERS, MD;  Location: [facility] OR;  Service: Orthopedics;  Laterality: Right;   VAGINAL HYSTERECTOMY      Prior to Admission medications  Medication Sig Start Date End Date Taking? Authorizing Provider  aspirin  EC 81 MG tablet Take 81 mg by mouth daily. Swallow whole.   Yes [provider]  rosuvastatin (CRESTOR) 10 MG tablet Take 10 mg by mouth daily.   Yes [provider]  acetaminophen   (TYLENOL ) 500 MG tablet Take 500-1,000 mg by mouth every 6 (six) hours as needed (pain.).    [provider]  Calcium-Vitamin D-Vitamin K (VIACTIV CALCIUM PLUS D) 650-12.5-40 MG-MCG-MCG CHEW Chew 2 tablets by mouth in the morning.    [provider]  cyanocobalamin  (VITAMIN B12) 1000 MCG tablet Take 2,000 mcg by mouth in the morning.    [provider]  fluticasone (KLS ALLER-FLO) 50 MCG/ACT nasal spray Place 1 spray into both nostrils daily.    [provider]  Multiple Vitamin (MULTIVITAMIN WITH MINERALS) TABS tablet Take 2 tablets by mouth daily. Thrive Daily Multivitamin    [provider]  Omega-3 Fatty Acids (FISH OIL) 1200 MG CAPS Take 3,600 mg by mouth in the morning.    [provider]    Allergies as of 02/09/2024 - Review Complete 10/18/2021  Allergen Reaction Noted   Levaquin [levofloxacin in d5w] Nausea Only 07/03/2015    Family History  Problem Relation Age of Onset   Osteoporosis Mother    Parkinson's disease Mother    Diabetes Father    Heart disease Father    Breast cancer Sister 44   Ovarian cancer Sister    Diabetes Sister    Breast cancer Maternal Aunt 72       43   Breast cancer Maternal Aunt    Colon cancer Paternal  Uncle    Breast cancer Cousin    Breast cancer Cousin     Social History   Socioeconomic History   Marital status: Married    Spouse name: Not on file   Number of children: Not on file   Years of education: Not on file   Highest education level: Not on file  Occupational History   Not on file  Tobacco Use   Smoking status: Never   Smokeless tobacco: Never  Vaping Use   Vaping status: Never Used  Substance and Sexual Activity   Alcohol use: Yes    Comment: maybe 1 drink every few months   Drug use: No   Sexual activity: Yes    Birth control/protection: Surgical    Comment: hysterectomy  Other Topics Concern   Not on file  Social History Narrative   Not on file   Social  Drivers of Health   Tobacco Use: Low Risk  (12/22/2023)   Received from [facility]   Patient History    Smoking Tobacco Use: Never    Smokeless Tobacco Use: Never    Passive Exposure: Not on file  Financial Resource Strain: Low Risk  (12/22/2023)   [facility] from [facility]   Overall Financial Resource Strain (CARDIA)    Difficulty of Paying Living Expenses: Not hard at all  Food Insecurity: No Food Insecurity (12/22/2023)   Received from [facility]   Epic    Within the past 12 months, you worried that your food would run out before you got the money to buy more.: Never true    Within the past 12 months, the food you bought just didn't last and you didn't have money to get more.: Never true  Transportation Needs: No Transportation Needs (12/22/2023)   Received from [facility] - Transportation    In the past 12 months, has lack of transportation kept you from medical appointments or from getting medications?: No    Lack of Transportation (Non-Medical): No  Physical Activity: Not on file  Stress: Not on file  Social Connections: Not on file  Intimate Partner Violence: Not on file  Depression (EYV7-0): Not on file  Alcohol Screen: Not on file  Housing: Low Risk  (12/22/2023)   Received from [facility]   Epic    In the last 12 months, was there a time when you were not able to pay the mortgage or rent on time?: No    In the past 12 months, how many times have you moved where you were living?: 0    At any time in the past 12 months, were you homeless or living in a shelter (including now)?: No  Utilities: Not At Risk (12/22/2023)   Received from [facility]   Epic    In the past 12 months has the electric, gas, oil, or water  company threatened to shut off services in your home?: No  Health Literacy: Not on file    Review of Systems: See HPI, otherwise negative  ROS  Physical Exam: Ht 5' 6 (1.676 m)   Wt 88.5 kg   BMI 31.47 kg/m  CONSTITUTIONAL: Well-appearing in no acute distress.  HEENT: Pupils equal, round, Extraocular movements intact. Conjunctivae clear NECK: Neck supple CARDIOVASCULAR: Regular rate, no LE edema  RESPIRATORY: No labored breathing  ABDOMEN: Abdomen soft, nontender, not distended, no guarding, no rigidity SKIN: No apparent skin rashes or lesions. NEUROLOGIC:  Normal speech, no focal findings. Mental status alert and oriented x4. PSYCHIATRIC: Mood and affect normal.   Impression/Plan: Sharon Lewis is now here to undergo a screening colonoscopy.  Risks, benefits, and alternatives regarding colonoscopy have been reviewed with the patient.  Questions have been answered.  All parties agreeable.  "

## 2024-04-02 NOTE — Anesthesia Postprocedure Evaluation (Signed)
"   Anesthesia Post Note  Patient: Sharon Lewis  Procedure(s) Performed: COLONOSCOPY POLYPECTOMY, INTESTINE (Rectum)  Patient location during evaluation: PACU Anesthesia Type: General Level of consciousness: awake and alert Pain management: pain level controlled Vital Signs Assessment: post-procedure vital signs reviewed and stable Respiratory status: spontaneous breathing, nonlabored ventilation, respiratory function stable and patient connected to nasal cannula oxygen Cardiovascular status: stable and blood pressure returned to baseline Postop Assessment: no apparent nausea or vomiting Anesthetic complications: no   No notable events documented.   Last Vitals:  Vitals:   04/02/24 0910 04/02/24 0913  BP:    Pulse: 75 75  Resp:  19  Temp:    SpO2: 96% 96%    Last Pain:  Vitals:   04/02/24 0749  TempSrc: Temporal  PainSc: 0-No pain                 Redell MARLA Breaker      "

## 2024-04-02 NOTE — Op Note (Signed)
 [facility]astroenterology Patient Name: Sharon Lewis Procedure Date: 04/02/2024 8:37 AM MRN: [id number] Account #: [id number] Date of Birth: 1960/09/03 Admit Type: Outpatient Age: 64 Room: [facility] OR ROOM 01 Gender: Female Note Status: Finalized Instrument Name: Colonoscope [id number] Procedure:             Colonoscopy Indications:           Screening for colorectal malignant neoplasm Providers:             Clotilda Schaffer, MD Referring MD:          Ophelia Sage, MD (Referring MD) Medicines:             Propofol  per Anesthesia Complications:         No immediate complications. Procedure:             Pre-Anesthesia Assessment:                        - Prior to the procedure, a History and Physical was                         performed, and patient medications and allergies were                         reviewed. The patient's tolerance of previous                         anesthesia was also reviewed. The risks and benefits                         of the procedure and the sedation options and risks                         were discussed with the patient. All questions were                         answered, and informed consent was obtained. Prior                         Anticoagulants: The patient has taken no anticoagulant                         or antiplatelet agents. ASA Grade Assessment: III - A                         patient with severe systemic disease. After reviewing                         the risks and benefits, the patient was deemed in                         satisfactory condition to undergo the procedure.                        After obtaining informed consent, the colonoscope was                         passed under direct vision. Throughout the procedure,  the patient's blood pressure, pulse, and oxygen                         saturations were monitored continuously. The                         Colonoscope was introduced through the  anus and                         advanced to the the cecum, identified by appendiceal                         orifice and ileocecal valve. The colonoscopy was                         performed without difficulty. The patient tolerated                         the procedure well. The quality of the bowel                         preparation was good. The ileocecal valve, appendiceal                         orifice, and rectum were photographed. Findings:      A 6 mm polyp was found in the cecum. The polyp was sessile. The polyp       was removed with a cold snare. Resection and retrieval were complete.       Estimated blood loss: none.      A few medium-mouthed diverticula were found in the sigmoid colon.      External and internal hemorrhoids were found. The hemorrhoids were Grade       I (internal hemorrhoids that do not prolapse). Impression:            - One 6 mm polyp in the cecum, removed with a cold                         snare. Resected and retrieved.                        - Diverticulosis in the sigmoid colon.                        - External and internal hemorrhoids. Recommendation:        - Await pathology results.                        - Repeat colonoscopy in 5-10 years for surveillance                         based on pathology results.                        - The findings and recommendations were discussed with                         the designated responsible adult. Procedure Code(s):     --- Professional ---  54614, Colonoscopy, flexible; with removal of                         tumor(s), polyp(s), or other lesion(s) by snare                         technique Diagnosis Code(s):     --- Professional ---                        Z12.11, Encounter for screening for malignant neoplasm                         of colon                        D12.0, Benign neoplasm of cecum                        K64.0, First degree hemorrhoids CPT copyright 2022  [employer]. All rights reserved. The codes documented in this report are preliminary and upon coder review may  be revised to meet current compliance requirements. Clotilda Schaffer, MD 04/02/2024 9:08:09 AM Number of Addenda: 0 Note Initiated On: 04/02/2024 8:37 AM Scope Withdrawal Time: 0 hours 7 minutes 5 seconds  Total Procedure Duration: 0 hours 11 minutes 21 seconds  Estimated Blood Loss:  Estimated blood loss: none.      [facility]

## 2024-04-02 NOTE — Anesthesia Preprocedure Evaluation (Addendum)
"                                    Anesthesia Evaluation  Patient identified by MRN, date of birth, ID band Patient awake    Reviewed: Allergy & Precautions, H&P , NPO status , Patient's Chart, lab work & pertinent test results, reviewed documented beta blocker date and time   History of Anesthesia Complications (+) PONV and history of anesthetic complications  Airway Mallampati: II  TM Distance: >3 FB Neck ROM: Full    Dental no notable dental hx.    Pulmonary neg pulmonary ROS, asthma    Pulmonary exam normal breath sounds clear to auscultation       Cardiovascular Exercise Tolerance: Good negative cardio ROS Normal cardiovascular exam Rhythm:Regular Rate:Normal     Neuro/Psych  Headaches negative neurological ROS  negative psych ROS   GI/Hepatic negative GI ROS, Neg liver ROS,,,  Endo/Other  negative endocrine ROS    Renal/GU negative Renal ROS  negative genitourinary   Musculoskeletal negative musculoskeletal ROS (+)    Abdominal   Peds negative pediatric ROS (+)  Hematology negative hematology ROS (+) Blood dyscrasia, anemia   Anesthesia Other Findings   Reproductive/Obstetrics negative OB ROS                              Anesthesia Physical Anesthesia Plan  ASA: 2  Anesthesia Plan: General   Post-op Pain Management:    Induction: Intravenous  PONV Risk Score and Plan:   Airway Management Planned:   Additional Equipment:   Intra-op Plan:   Post-operative Plan: Extubation in OR  Informed Consent: I have reviewed the patients History and Physical, chart, labs and discussed the procedure including the risks, benefits and alternatives for the proposed anesthesia with the patient or authorized representative who has indicated his/her understanding and acceptance.     Dental advisory given  Plan Discussed with: CRNA  Anesthesia Plan Comments:          Anesthesia Quick Evaluation  "

## 2024-04-27 ENCOUNTER — Encounter
